# Patient Record
Sex: Female | Born: 1960
Health system: Southern US, Community
[De-identification: ages and names within clinical notes are randomized; demographics above are authoritative.]

## PROBLEM LIST (undated history)

## (undated) DIAGNOSIS — C44300 Unspecified malignant neoplasm of skin of unspecified part of face: Secondary | ICD-10-CM

## (undated) DIAGNOSIS — C44702 Unspecified malignant neoplasm of skin of right lower limb, including hip: Secondary | ICD-10-CM

## (undated) DIAGNOSIS — E079 Disorder of thyroid, unspecified: Secondary | ICD-10-CM

## (undated) HISTORY — PX: BREAST SURGERY: SHX581

## (undated) HISTORY — DX: Unspecified malignant neoplasm of skin of right lower limb, including hip: C44.702

## (undated) HISTORY — DX: Disorder of thyroid, unspecified: E07.9

## (undated) HISTORY — PX: SKIN CANCER EXCISION: SHX779

## (undated) HISTORY — DX: Unspecified malignant neoplasm of skin of unspecified part of face: C44.300

---

## 2005-10-03 ENCOUNTER — Encounter (HOSPITAL_COMMUNITY): Admission: RE | Admit: 2005-10-03 | Discharge: 2005-10-05 | Payer: Self-pay | Admitting: Internal Medicine

## 2005-12-26 ENCOUNTER — Emergency Department (HOSPITAL_COMMUNITY): Admission: EM | Admit: 2005-12-26 | Discharge: 2005-12-26 | Payer: Self-pay | Admitting: Emergency Medicine

## 2006-08-06 ENCOUNTER — Encounter (HOSPITAL_COMMUNITY): Admission: RE | Admit: 2006-08-06 | Discharge: 2006-11-04 | Payer: Self-pay | Admitting: Endocrinology

## 2010-02-07 ENCOUNTER — Encounter: Admission: RE | Admit: 2010-02-07 | Discharge: 2010-02-07 | Payer: Self-pay | Admitting: Family Medicine

## 2010-11-26 ENCOUNTER — Encounter: Payer: Self-pay | Admitting: Internal Medicine

## 2010-11-27 ENCOUNTER — Encounter: Payer: Self-pay | Admitting: Endocrinology

## 2012-11-06 DIAGNOSIS — C44300 Unspecified malignant neoplasm of skin of unspecified part of face: Secondary | ICD-10-CM

## 2012-11-06 HISTORY — DX: Unspecified malignant neoplasm of skin of unspecified part of face: C44.300

## 2013-08-06 ENCOUNTER — Encounter: Payer: Self-pay | Admitting: *Deleted

## 2013-08-19 ENCOUNTER — Ambulatory Visit: Payer: 59 | Admitting: Family Medicine

## 2013-08-19 VITALS — BP 122/74 | HR 75 | Temp 98.4°F | Resp 17 | Ht 64.0 in | Wt 120.0 lb

## 2013-08-19 DIAGNOSIS — H109 Unspecified conjunctivitis: Secondary | ICD-10-CM

## 2013-08-19 DIAGNOSIS — H579 Unspecified disorder of eye and adnexa: Secondary | ICD-10-CM

## 2013-08-19 DIAGNOSIS — H5789 Other specified disorders of eye and adnexa: Secondary | ICD-10-CM

## 2013-08-19 MED ORDER — TOBRAMYCIN-DEXAMETHASONE 0.3-0.1 % OP SUSP: 1.0000 [drp] | OPHTHALMIC | Status: DC

## 2013-08-19 NOTE — Patient Instructions (Signed)
Conjunctivitis °Conjunctivitis is commonly called "pink eye." Conjunctivitis can be caused by bacterial or viral infection, allergies, or injuries. There is usually redness of the lining of the eye, itching, discomfort, and sometimes discharge. There may be deposits of matter along the eyelids. A viral infection usually causes a watery discharge, while a bacterial infection causes a yellowish, thick discharge. Pink eye is very contagious and spreads by direct contact. °You may be given antibiotic eyedrops as part of your treatment. Before using your eye medicine, remove all drainage from the eye by washing gently with warm water and cotton balls. Continue to use the medication until you have awakened 2 mornings in a row without discharge from the eye. Do not rub your eye. This increases the irritation and helps spread infection. Use separate towels from other household members. Wash your hands with soap and water before and after touching your eyes. Use cold compresses to reduce pain and sunglasses to relieve irritation from light. Do not wear contact lenses or wear eye makeup until the infection is gone. °SEEK MEDICAL CARE IF:  °· Your symptoms are not better after 3 days of treatment. °· You have increased pain or trouble seeing. °· The outer eyelids become very red or swollen. °Document Released: 11/30/2004 Document Revised: 01/15/2012 Document Reviewed: 10/23/2005 °ExitCare® Patient Information ©2014 [employer]. ° °Allergic Conjunctivitis °The conjunctiva is a thin membrane that covers the visible white part of the eyeball and the underside of the eyelids. This membrane protects and lubricates the eye. The membrane has small blood vessels running through it that can normally be seen. When the conjunctiva becomes inflamed, the condition is called conjunctivitis. In response to the inflammation, the conjunctival blood vessels become swollen. The swelling results in redness in the normally white part of the  eye. °The blood vessels of this membrane also react when a person has allergies and is then called allergic conjunctivitis. This condition usually lasts for as long as the allergy persists. Allergic conjunctivitis cannot be passed to another person (non-contagious). The likelihood of bacterial infection is great and the cause is not likely due to allergies if the inflamed eye has: °· A sticky discharge. °· Discharge or sticking together of the lids in the morning. °· Scaling or flaking of the eyelids where the eyelashes come out. °· Red swollen eyelids. °CAUSES  °· Viruses. °· Irritants such as foreign bodies. °· Chemicals. °· General allergic reactions. °· Inflammation or serious diseases in the inside or the outside of the eye or the orbit (the boney cavity in which the eye sits) can cause a "red eye." °SYMPTOMS  °· Eye redness. °· Tearing. °· Itchy eyes. °· Burning feeling in the eyes. °· Clear drainage from the eye. °· Allergic reaction due to pollens or ragweed sensitivity. Seasonal allergic conjunctivitis is frequent in the spring when pollens are in the air and in the fall. °DIAGNOSIS  °This condition, in its many forms, is usually diagnosed based on the history and an ophthalmological exam. It usually involves both eyes. If your eyes react at the same time every year, allergies may be the cause. While most "red eyes" are due to allergy or an infection, the role of an eye (ophthalmological) exam is important. The exam can rule out serious diseases of the eye or orbit. °TREATMENT  °· Non-antibiotic eye drops, ointments, or medications by mouth may be prescribed if the ophthalmologist is sure the conjunctivitis is due to allergies alone. °· Over-the-counter drops and ointments for allergic symptoms should   be used only after other causes of conjunctivitis have been ruled out, or as your caregiver suggests. °Medications by mouth are often prescribed if other allergy-related symptoms are present. If the  ophthalmologist is sure that the conjunctivitis is due to allergies alone, treatment is normally limited to drops or ointments to reduce itching and burning. °HOME CARE INSTRUCTIONS  °· Wash hands before and after applying drops or ointments, or touching the inflamed eye(s) or eyelids. °· Do not let the eye dropper tip or ointment tube touch the eyelid when putting medicine in your eye. °· Stop using your soft contact lenses and throw them away. Use a new pair of lenses when recovery is complete. You should run through sterilizing cycles at least three times before use after complete recovery if the old soft contact lenses are to be used. Hard contact lenses should be stopped. They need to be thoroughly sterilized before use after recovery. °· Itching and burning eyes due to allergies is often relieved by using a cool cloth applied to closed eye(s). °SEEK MEDICAL CARE IF:  °· Your problems do not go away after two or three days of treatment. °· Your lids are sticky (especially in the morning when you wake up) or stick together. °· Discharge develops. Antibiotics may be needed either as drops, ointment, or by mouth. °· You have extreme light sensitivity. °· An oral temperature above 102° F (38.9° C) develops. °· Pain in or around the eye or any other visual symptom develops. °MAKE SURE YOU:  °· Understand these instructions. °· Will watch your condition. °· Will get help right away if you are not doing well or get worse. °Document Released: 01/13/2003 Document Revised: 01/15/2012 Document Reviewed: 12/09/2007 °ExitCare® Patient Information ©2014 [employer]. ° °

## 2013-08-19 NOTE — Progress Notes (Signed)
  [facility]:  Office Visit  Chief Complaint:  Chief Complaint  Patient presents with  . Eye Injury    swelling and redness     HPI: Amber Williamson is a 52 y.o. female who is here for  2 week history of right eye irritation, she has has redness and also itching. No pain. Some swelling along nasal area.  No HA, wears contacts and has been wearing it even with eye irritation, not . No rashes, no h/o of shingles. No vision changes, no eye pain.  + eye redness, + minimal clear dc. No pus.   History reviewed. No pertinent past medical history. History reviewed. No pertinent past surgical history. History   Social History  . Marital Status: Married    Spouse Name: N/A    Number of Children: N/A  . Years of Education: N/A   Social History Main Topics  . Smoking status: Never Smoker   . Smokeless tobacco: None  . Alcohol Use: No  . Drug Use: No  . Sexual Activity: No   Other Topics Concern  . None   Social History Narrative  . None   History reviewed. No pertinent family history. No Known Allergies Prior to Admission medications   Not on File     ROS: The patient denies fevers, chills, night sweats, unintentional weight loss, chest pain, palpitations, wheezing, dyspnea on exertion, nausea, vomiting, abdominal pain, dysuria, hematuria, melena, numbness, weakness, or tingling.  All other systems have been reviewed and were otherwise negative with the exception of those mentioned in the HPI and as above.    PHYSICAL EXAM: Filed Vitals:   08/19/13 1812  BP: 122/74  Pulse: 75  Temp: 98.4 F (36.9 C)  Resp: 17   Filed Vitals:   08/19/13 1812  Height: 5\' 4"  (1.626 m)  Weight: 120 lb (54.432 kg)   Body mass index is 20.59 kg/(m^2).  General: Alert, no acute distress HEENT:  Normocephalic, atraumatic, oropharynx patent. EOMI, PERRLA, fundoscopic exam nl, + erythematous conjunctiva right eye, + clear dc Cardiovascular:   No pedal edema.   Respiratory:  No cyanosis, no use of accessory musculature GI: No organomegaly, abdomen is soft and non-tender, positive bowel sounds.  No masses. Skin: No rashes. Neurologic: Facial musculature symmetric. Psychiatric: Patient is appropriate throughout our interaction. Musculoskeletal: Gait intact.   LABS: No results found for this or any previous visit.   EKG/XRAY:   Primary read interpreted by Dr. Conley Rolls at [facility].   ASSESSMENT/PLAN: Encounter Diagnoses  Name Primary?  . Conjunctivitis Yes  . Irritation of right eye    Fluoroscein exam normal Eye exam normal except for erythema Rx tobradex x 3 days Gross sideeffects, risk and benefits, and alternatives of medications d/w patient. Patient is aware that all medications have potential sideeffects and we are unable to predict every sideeffect or drug-drug interaction that may occur.  Hamilton Capri PHUONG, DO 08/19/2013 7:38 PM

## 2014-06-03 ENCOUNTER — Telehealth: Payer: Self-pay

## 2014-06-03 NOTE — Telephone Encounter (Signed)
Patient called to let us know that [facility] is sending over mammogram results to be placed in her record.  She is having the results send to Dr. Laney Pastor.  She has seen Dr. Marin Comment and Dr. Laney Pastor and will be making an appointment in the future.

## 2014-08-03 ENCOUNTER — Encounter: Payer: Self-pay | Admitting: Family Medicine

## 2015-02-23 ENCOUNTER — Ambulatory Visit (INDEPENDENT_AMBULATORY_CARE_PROVIDER_SITE_OTHER): Payer: 59 | Admitting: Emergency Medicine

## 2015-02-23 VITALS — BP 128/66 | HR 93 | Temp 98.3°F | Resp 16 | Ht 63.5 in | Wt 121.0 lb

## 2015-02-23 DIAGNOSIS — J020 Streptococcal pharyngitis: Secondary | ICD-10-CM | POA: Diagnosis not present

## 2015-02-23 DIAGNOSIS — Z1211 Encounter for screening for malignant neoplasm of colon: Secondary | ICD-10-CM

## 2015-02-23 MED ORDER — PENICILLIN V POTASSIUM 500 MG PO TABS: 500.0000 mg | ORAL_TABLET | Freq: Four times a day (QID) | ORAL | Status: DC

## 2015-02-23 NOTE — Patient Instructions (Signed)

## 2015-02-23 NOTE — Progress Notes (Signed)
[facility] [address] [place] [address] [phone]  Date:  9/89/2119   Name:  Amber Williamson   DOB:  Aug 08, 1961   MRN:  [id number]  PCP:  No primary care provider on file.    Chief Complaint: Sore Throat; Dizziness; joint/pain; Numbness; Hot Flashes; and Fatigue   History of Present Illness:  Amber Williamson is a 54 y.o. very pleasant female patient who presents with the following:  Sore throat Sunday.  Sudden onset Now fatigued and has no energy Has generalized malaise and myalgias No cough or coryza No wheezing or shortness of breath Fever and chilled feeling. No ill contacts. No rash  No nausea or vomiting No improvement with over the counter medications or other home remedies.  Denies other complaint or health concern today.   There are no active problems to display for this patient.   Past Medical History  Diagnosis Date  . Thyroid disease     Past Surgical History  Procedure Laterality Date  . Cosmetic surgery      History  Substance Use Topics  . Smoking status: Current Some Day Smoker  . Smokeless tobacco: Not on file  . Alcohol Use: No    History reviewed. No pertinent family history.  No Known Allergies  Medication list has been reviewed and updated.  No current outpatient prescriptions on file prior to visit.   No current facility-administered medications on file prior to visit.    Review of Systems:  As per HPI, otherwise negative.    Physical Examination: Filed Vitals:   02/23/15 1415  BP: 128/66  Pulse: 93  Temp: 98.3 F (36.8 C)  Resp: 16   Filed Vitals:   02/23/15 1415  Height: 5' 3.5" (1.613 m)  Weight: 121 lb (54.885 kg)   Body mass index is 21.1 kg/(m^2). Ideal Body Weight: Weight in (lb) to have BMI = 25: 143.1  GEN: WDWN, NAD, Non-toxic, A & O x 3 HEENT: Atraumatic, Normocephalic. Neck supple. No masses, No LAD.erythematous and injected oropharynx Ears and Nose: No external  deformity. CV: RRR, No M/G/R. No JVD. No thrill. No extra heart sounds. PULM: CTA B, no wheezes, crackles, rhonchi. No retractions. No resp. distress. No accessory muscle use. ABD: S, NT, ND, +BS. No rebound. No HSM. EXTR: No c/c/e NEURO Normal gait.  PSYCH: Normally interactive. Conversant. Not depressed or anxious appearing.  Calm demeanor.    Assessment and Plan: Strep Pen vk  Signed,  Ellison Carwin, MD

## 2015-08-06 LAB — HM MAMMOGRAPHY

## 2016-11-06 DIAGNOSIS — C44702 Unspecified malignant neoplasm of skin of right lower limb, including hip: Secondary | ICD-10-CM

## 2016-11-06 HISTORY — DX: Unspecified malignant neoplasm of skin of right lower limb, including hip: C44.702

## 2017-02-26 ENCOUNTER — Ambulatory Visit (INDEPENDENT_AMBULATORY_CARE_PROVIDER_SITE_OTHER): Payer: 59 | Admitting: Family Medicine

## 2017-02-26 VITALS — BP 115/71 | HR 69 | Temp 97.9°F | Resp 16 | Ht 63.5 in | Wt 120.0 lb

## 2017-02-26 DIAGNOSIS — S81801A Unspecified open wound, right lower leg, initial encounter: Secondary | ICD-10-CM

## 2017-02-26 DIAGNOSIS — Z85828 Personal history of other malignant neoplasm of skin: Secondary | ICD-10-CM

## 2017-02-26 MED ORDER — MUPIROCIN CALCIUM 2 % EX CREA: 1.0000 "application " | TOPICAL_CREAM | Freq: Two times a day (BID) | CUTANEOUS | 0 refills | Status: DC

## 2017-02-26 NOTE — Patient Instructions (Addendum)
Soap and water cleansing over area once daily times per day, apply Bactroban ointment once or twice per day. I will refer you to dermatology to evaluate this area further, but try to avoid scraping area until seen by dermatology.   Return to the clinic or go to the nearest emergency room if any of your symptoms worsen or new symptoms occur.   IF you received an x-ray today, you will receive an invoice from [facility]. Please contact [facility] at [phone] with questions or concerns regarding your invoice.   IF you received labwork today, you will receive an invoice from [facility]. Please contact [facility] at [phone] with questions or concerns regarding your invoice.   Our billing staff will not be able to assist you with questions regarding bills from these companies.  You will be contacted with the lab results as soon as they are available. The fastest way to get your results is to activate your My Chart account. Instructions are located on the last page of this paperwork. If you have not heard from [place] regarding the results in 2 weeks, please contact this office.

## 2017-02-26 NOTE — Progress Notes (Signed)
By signing my name below, I, Amber Williamson, attest that this documentation has been prepared under the direction and in the presence of Amber Ray, MD.  Electronically Signed: Verlee Williamson, Medical Scribe. 02/26/17. 6:03 PM.  Subjective:    Patient ID: Amber Williamson, female    DOB: 1961-04-05, 56 y.o.   MRN: [id number]  HPI Chief Complaint  Patient presents with  . Insect Bite    HPI Comments: Amber Williamson is a 56 y.o. female who presents to the Primary Care at [facility] complaining of sore wound located on her right lower leg, above ankle onset 2 months ago. It was initially was a bump and then it turned into a pimple. Pt has been using OTC with a silver component in it, and hydrogen peroxide intermittently without relief of her sxs. Pt opened it with a needle a couple of weeks ago and it didn't have any drainage. She also admits accidentally cutting the top when shaving her legs and mentions having dry/itchy/scaly skin. Pt has a dermatologist [facility]) but hasn't been seen for her sxs. Pt had a superficial skin CA on her forehead located in between the eyes 6-7 years ago - was removed and hasn't found anymore since. Denies being on abx for the past 2 months for her wound. Denies itchiness from the concerned area.  There are no active problems to display for this patient.  Past Medical History:  Diagnosis Date  . Thyroid disease    Past Surgical History:  Procedure Laterality Date  . COSMETIC SURGERY     No Known Allergies Prior to Admission medications   Medication Sig Start Date End Date Taking? Authorizing Provider  penicillin v potassium (VEETID) 500 MG tablet Take 1 tablet (500 mg total) by mouth 4 (four) times daily. Patient not taking: Reported on 02/26/2017 02/23/15   Roselee Culver, MD   Social History   Social History  . Marital status: Married    Spouse name: N/A  . Number of children: N/A  . Years of  education: N/A   Occupational History  . Not on file.   Social History Main Topics  . Smoking status: Current Some Day Smoker  . Smokeless tobacco: Never Used  . Alcohol use No  . Drug use: No  . Sexual activity: No   Other Topics Concern  . Not on file   Social History Narrative  . No narrative on file   Review of Systems  Skin: Positive for color change and wound. Negative for rash.   Objective:  Physical Exam  Constitutional: She appears well-developed and well-nourished. No distress.  HENT:  Head: Normocephalic and atraumatic.  Eyes: Conjunctivae are normal.  Neck: Neck supple.  Cardiovascular: Normal rate.   Pulmonary/Chest: Effort normal.  Neurological: She is alert.  Skin: Skin is warm and dry.  On right lower lateral leg there is a lesion that measures 2 cm across with minimal erythema, more pink appearence Hyperkeratotic centrally with scar tissue centrally  Psychiatric: She has a normal mood and affect. Her behavior is normal.  Nursing note and vitals reviewed.   Vitals:   02/26/17 1704  BP: 115/71  Pulse: 69  Resp: 16  Temp: 97.9 F (36.6 C)  TempSrc: Oral  SpO2: 100%  Weight: 120 lb (54.4 kg)  Height: 5' 3.5" (1.613 m)  Body mass index is 20.92 kg/m. Assessment & Plan:   OTIS BURRESS is a 56 y.o. female Wound of right lower  extremity, initial encounter - Plan: mupirocin cream (BACTROBAN) 2 %, Ambulatory referral to Dermatology  History of skin cancer - Plan: Ambulatory referral to Dermatology  Persistent lesion on lower leg, initial history possible papule versus infected area, then with scar tissue. May have irritated area to cause more scar tissue and delayed healing, but with length of time of area, and previous history of skin cancer, recommended evaluation with dermatology. For now can apply Bactroban cream, otherwise avoid other interventions to affected area until evaluated by dermatology.  Meds ordered this encounter  Medications    . mupirocin cream (BACTROBAN) 2 %    Sig: Apply 1 application topically 2 (two) times daily.    Dispense:  15 g    Refill:  0   Patient Instructions   Soap and water cleansing over area once daily times per day, apply Bactroban ointment once or twice per day. I will refer you to dermatology to evaluate this area further, but try to avoid scraping area until seen by dermatology.   Return to the clinic or go to the nearest emergency room if any of your symptoms worsen or new symptoms occur.   IF you received an x-Williamson today, you will receive an invoice from [facility]. Please contact [facility] at [phone] with questions or concerns regarding your invoice.   IF you received labwork today, you will receive an invoice from [facility]. Please contact [facility] at [phone] with questions or concerns regarding your invoice.   Our billing staff will not be able to assist you with questions regarding bills from these companies.  You will be contacted with the lab results as soon as they are available. The fastest way to get your results is to activate your My Chart account. Instructions are located on the last page of this paperwork. If you have not heard from [place] regarding the results in 2 weeks, please contact this office.       I personally performed the services described in this documentation, which was scribed in my presence. The recorded information has been reviewed and considered for accuracy and completeness, addended by me as needed, and agree with information above.  Signed,   Amber Ray, MD Primary [facility] at [facility].  02/27/17 10:21 PM

## 2017-02-27 LAB — HM MAMMOGRAPHY

## 2017-04-11 ENCOUNTER — Encounter: Payer: Self-pay | Admitting: Family Medicine

## 2017-04-11 DIAGNOSIS — Z85828 Personal history of other malignant neoplasm of skin: Secondary | ICD-10-CM | POA: Insufficient documentation

## 2018-01-04 DIAGNOSIS — Z08 Encounter for follow-up examination after completed treatment for malignant neoplasm: Secondary | ICD-10-CM | POA: Diagnosis not present

## 2018-01-04 DIAGNOSIS — Z85828 Personal history of other malignant neoplasm of skin: Secondary | ICD-10-CM | POA: Diagnosis not present

## 2018-01-04 DIAGNOSIS — L570 Actinic keratosis: Secondary | ICD-10-CM | POA: Diagnosis not present

## 2018-01-04 DIAGNOSIS — X32XXXD Exposure to sunlight, subsequent encounter: Secondary | ICD-10-CM | POA: Diagnosis not present

## 2018-01-13 ENCOUNTER — Ambulatory Visit: Payer: Self-pay | Admitting: Emergency Medicine

## 2018-01-13 VITALS — BP 114/70 | HR 89 | Temp 98.4°F | Resp 20

## 2018-01-13 DIAGNOSIS — J069 Acute upper respiratory infection, unspecified: Secondary | ICD-10-CM

## 2018-01-13 NOTE — Patient Instructions (Signed)

## 2018-01-13 NOTE — Progress Notes (Signed)
Subjective:     Amber Williamson is a 57 y.o. female who presents for evaluation of symptoms of a URI. Symptoms include achiness, congestion, coryza, nasal congestion, post nasal drip and sore throat. Onset of symptoms was 1 day ago, and has been unchanged since that time. Treatment to date: none. Denies n/v/d, myalgias, decreased appetite     Review of Systems Pertinent items are noted in HPI.   Objective:    BP 114/70 (BP Location: Right Arm, Patient Position: Sitting, Cuff Size: Normal)   Pulse 89   Temp 98.4 F (36.9 C) (Oral)   Resp 20   SpO2 98%  General appearance: alert, cooperative and appears stated age Head: Normocephalic, without obvious abnormality, atraumatic Ears: normal TM's and external ear canals both ears Nose: Nares normal. Septum midline. Mucosa normal. No drainage or sinus tenderness. Throat: lips, mucosa, and tongue normal; teeth and gums normal Neck: no adenopathy Lungs: clear to auscultation bilaterally Heart: regular rate and rhythm Extremities: extremities normal, atraumatic, no cyanosis or edema Pulses: 2+ and symmetric   Assessment:    viral upper respiratory illness   Plan:    Discussed diagnosis and treatment of URI. Discussed the importance of avoiding unnecessary antibiotic therapy. Suggested symptomatic OTC remedies. Nasal saline spray for congestion. Follow up as needed. Follow up in 1 week or as needed. Tylenol or motrin, Antihistamines, decongestants, rest, fluids, return as needed

## 2018-01-15 ENCOUNTER — Telehealth: Payer: Self-pay | Admitting: Emergency Medicine

## 2018-02-16 ENCOUNTER — Ambulatory Visit (INDEPENDENT_AMBULATORY_CARE_PROVIDER_SITE_OTHER): Payer: 59 | Admitting: Physician Assistant

## 2018-02-16 ENCOUNTER — Encounter: Payer: Self-pay | Admitting: Physician Assistant

## 2018-02-16 ENCOUNTER — Other Ambulatory Visit: Payer: Self-pay

## 2018-02-16 VITALS — BP 102/68 | HR 80 | Temp 97.8°F | Resp 18 | Ht 63.0 in | Wt 116.2 lb

## 2018-02-16 DIAGNOSIS — Z1211 Encounter for screening for malignant neoplasm of colon: Secondary | ICD-10-CM | POA: Diagnosis not present

## 2018-02-16 DIAGNOSIS — Z114 Encounter for screening for human immunodeficiency virus [HIV]: Secondary | ICD-10-CM

## 2018-02-16 DIAGNOSIS — Z13220 Encounter for screening for lipoid disorders: Secondary | ICD-10-CM

## 2018-02-16 DIAGNOSIS — Z0000 Encounter for general adult medical examination without abnormal findings: Secondary | ICD-10-CM | POA: Diagnosis not present

## 2018-02-16 DIAGNOSIS — Z130 Encounter for screening for diseases of the blood and blood-forming organs and certain disorders involving the immune mechanism: Secondary | ICD-10-CM

## 2018-02-16 DIAGNOSIS — Z13228 Encounter for screening for other metabolic disorders: Secondary | ICD-10-CM | POA: Diagnosis not present

## 2018-02-16 DIAGNOSIS — Z1159 Encounter for screening for other viral diseases: Secondary | ICD-10-CM | POA: Diagnosis not present

## 2018-02-16 DIAGNOSIS — Z1389 Encounter for screening for other disorder: Secondary | ICD-10-CM

## 2018-02-16 DIAGNOSIS — Z1329 Encounter for screening for other suspected endocrine disorder: Secondary | ICD-10-CM

## 2018-02-16 NOTE — Progress Notes (Signed)
Patient ID: Amber Williamson, female    DOB: May 21, 1961, 57 y.o.   MRN: [id number]  PCP: System, Provider Not In  Chief Complaint  Patient presents with  . Annual Exam    no pap     Subjective:   Presents for [employer].  Routine labs annually through her previous employer. Last hands-on exam about 6 years ago with GYN.  Cervical Cancer Screening: sees GYN Breast Cancer Screening: sees GYN Colorectal Cancer Screening: never. Not interested in colonoscopy. Is will to do Cologuard. Bone Density Testing: not yet a candidate HIV Screening: very low risk STI Screening: declines, very low risk Seasonal Influenza Vaccination: current, at work Td/Tdap Vaccination: current, at work Pneumococcal Vaccination: not yet a candidate Zoster Vaccination: not yet   Review of Systems  Constitutional: Negative for chills and fever.  HENT: Negative for congestion and sore throat.   Eyes: Negative for pain.  Respiratory: Negative for cough, shortness of breath and wheezing.   Cardiovascular: Negative for chest pain, palpitations and leg swelling.  Gastrointestinal: Negative for abdominal pain, blood in stool, constipation, diarrhea, nausea and vomiting.  Endocrine: Negative for polydipsia.  Genitourinary: Negative for dysuria, frequency, hematuria and urgency.  Musculoskeletal: Positive for neck pain and neck stiffness. Negative for back pain and myalgias.  Skin: Negative.  Negative for rash.  Allergic/Immunologic: Negative for environmental allergies.  Neurological: Negative for dizziness, weakness and headaches.  Hematological: Does not bruise/bleed easily.  Psychiatric/Behavioral: Positive for decreased concentration and dysphoric mood. Negative for suicidal ideas. The patient is nervous/anxious.     Depression screen PHQ 2/9 02/26/2017  Decreased Interest 0  Down, Depressed, Hopeless 0  PHQ - 2 Score 0    Patient Active Problem List   Diagnosis Date Noted  . History  of SCC (squamous cell carcinoma) of skin 04/11/2017    Past Medical History:  Diagnosis Date  . Cancer of skin of right leg 2018   RIGHT lower leg, squamous cell carcinoma  . Skin cancer of face 2014   squamous cell carcinoma  . Thyroid disease    hyperthyroidism, took a PTU blocker, resolved     Prior to Admission medications   Not on File    No Known Allergies  Past Surgical History:  Procedure Laterality Date  . BREAST SURGERY     augmentation  . SKIN CANCER EXCISION     Forehead 2014, RIGHT lower leg 2018; BCC    Family History  Problem Relation Age of Onset  . Heart disease Mother   . Atrial fibrillation Mother   . Thyroid disease Mother        hypothyroidism  . Heart disease Father   . COPD Father   . Healthy Daughter   . Healthy Son     Social History   Socioeconomic History  . Marital status: Married    Spouse name: Cory Roughen  . Number of children: 2  . Years of education: Not on file  . Highest education level: 12th grade  Occupational History  . 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 Financial resource strain: Not hard at all  . Food insecurity:    Worry: Never true    Inability: Never true  . Transportation needs:    Medical: No    Non-medical: No  Tobacco Use  . Smoking status: Former Research scientist (life sciences)  . Smokeless tobacco: Never Used  . Tobacco comment: Nicorette  gum since 2004  Substance and Sexual Activity  . Alcohol use: No    Alcohol/week: 0.0 - 0.6 oz    Comment: occasional glass of wine  . Drug use: No  . Sexual activity: Yes    Partners: Male    Birth control/protection: Post-menopausal  Lifestyle  . Physical activity:    Days per week: 0 days    Minutes per session: 0 min  . Stress: To some extent  Relationships  . Social connections:    Talks on phone: More than three times a week    Gets together: More than three times a week    Attends religious service: More than 4  times per year    Active member of club or organization: Yes    Attends meetings of clubs or organizations: More than 4 times per year    Relationship status: Married  Other Topics Concern  . Not on file  Social History Narrative   Lives with her husband, who has emphysema.   Adult children live in [place].   Husband's family in [place], [place].   Smoke detectors in home? Yes    Guns in home? No    Consistent seatbelt use? Yes    Consistent dental brushing and flossing? Yes    Semi-Annual dental visits: Yes    Annual Eye exams: Yes         Objective:  Physical Exam  Constitutional: She is oriented to person, place, and time. Vital signs are normal. She appears well-developed and well-nourished. She is active and cooperative. No distress.  BP 102/68   Pulse 80   Temp 97.8 F (36.6 C) (Oral)   Resp 18   Ht 5\' 3"  (1.6 m)   Wt 116 lb 3.2 oz (52.7 kg)   SpO2 98%   BMI 20.58 kg/m    HENT:  Head: Normocephalic and atraumatic.  Right Ear: Hearing, tympanic membrane, external ear and ear canal normal. No foreign bodies.  Left Ear: Hearing, tympanic membrane, external ear and ear canal normal. No foreign bodies.  Nose: Nose normal.  Mouth/Throat: Uvula is midline, oropharynx is clear and moist and mucous membranes are normal. No oral lesions. Normal dentition. No dental abscesses or uvula swelling. No oropharyngeal exudate.  Eyes: Pupils are equal, round, and reactive to light. Conjunctivae, EOM and lids are normal. Right eye exhibits no discharge. Left eye exhibits no discharge. No scleral icterus.  Fundoscopic exam:      The right eye shows no arteriolar narrowing, no AV nicking, no exudate, no hemorrhage and no papilledema. The right eye shows red reflex.       The left eye shows no arteriolar narrowing, no AV nicking, no exudate, no hemorrhage and no papilledema. The left eye shows red reflex.  Neck: Trachea normal, normal range of motion and full passive range of motion  without pain. Neck supple. No spinous process tenderness and no muscular tenderness present. No thyroid mass and no thyromegaly present.  Cardiovascular: Normal rate, regular rhythm, normal heart sounds, intact distal pulses and normal pulses.  Pulmonary/Chest: Effort normal and breath sounds normal.  Musculoskeletal: She exhibits no edema or tenderness.       Cervical back: Normal.       Thoracic back: Normal.       Lumbar back: Normal.  Lymphadenopathy:       Head (right side): No tonsillar, no preauricular, no posterior auricular and no occipital adenopathy present.       Head (left side):  No tonsillar, no preauricular, no posterior auricular and no occipital adenopathy present.    She has no cervical adenopathy.       Right: No supraclavicular adenopathy present.       Left: No supraclavicular adenopathy present.  Neurological: She is alert and oriented to person, place, and time. She has normal strength and normal reflexes. No cranial nerve deficit. She exhibits normal muscle tone. Coordination and gait normal.  Skin: Skin is warm, dry and intact. No rash noted. She is not diaphoretic. No cyanosis or erythema. Nails show no clubbing.  Psychiatric: She has a normal mood and affect. Her speech is normal and behavior is normal. Judgment and thought content normal.     Wt Readings from Last 3 Encounters:  02/16/18 116 lb 3.2 oz (52.7 kg)  02/26/17 120 lb (54.4 kg)  02/23/15 121 lb (54.9 kg)     Visual Acuity Screening   Right eye Left eye Both eyes  Without correction:     With correction: 20/20 20/40 20/20          Assessment & Plan:  1. Annual physical exam Age appropriate anticipatory guidance provided.  2. Screening for deficiency anemia - CBC with Differential/Platelet  3. Screening for colon cancer - Cologuard  4. Need for hepatitis C screening test - Hepatitis C antibody  5. Screening for HIV (human immunodeficiency virus) - HIV antibody  6. Screening for  hyperlipidemia - Lipid panel  7. Screening for metabolic disorder - Comprehensive metabolic panel  8. Screening for thyroid disorder History of hyperthyroidism, no longer on treatment - T4, free - TSH  9. Screening for blood or protein in urine - Urinalysis, dipstick only    No follow-ups on file.   Fara Chute, PA-C [facility]

## 2018-02-16 NOTE — Patient Instructions (Addendum)
Please schedule with your GYN.     IF you received an x-ray today, you will receive an invoice from [facility]. Please contact [facility] at [phone] with questions or concerns regarding your invoice.   IF you received labwork today, you will receive an invoice from [facility]. Please contact [facility] at [phone] with questions or concerns regarding your invoice.   Our billing staff will not be able to assist you with questions regarding bills from these companies.  You will be contacted with the lab results as soon as they are available. The fastest way to get your results is to activate your My Chart account. Instructions are located on the last page of this paperwork. If you have not heard from [place] regarding the results in 2 weeks, please contact this office.      Preventive Care 40-64 Years, Female Preventive care refers to lifestyle choices and visits with your health care provider that can promote health and wellness. What does preventive care include?  A yearly physical exam. This is also called an annual well check.  Dental exams once or twice a year.  Routine eye exams. Ask your health care provider how often you should have your eyes checked.  Personal lifestyle choices, including: ? Daily care of your teeth and gums. ? Regular physical activity. ? Eating a healthy diet. ? Avoiding tobacco and drug use. ? Limiting alcohol use. ? Practicing safe sex. ? Taking low-dose aspirin daily starting at age 78. ? Taking vitamin and mineral supplements as recommended by your health care provider. What happens during an annual well check? The services and screenings done by your health care provider during your annual well check will depend on your age, overall health, lifestyle risk factors, and family history of disease. Counseling Your health care provider may ask you questions about your:  Alcohol use.  Tobacco use.  Drug use.  Emotional  well-being.  Home and relationship well-being.  Sexual activity.  Eating habits.  Work and work Statistician.  Method of birth control.  Menstrual cycle.  Pregnancy history.  Screening You may have the following tests or measurements:  Height, weight, and BMI.  Blood pressure.  Lipid and cholesterol levels. These may be checked every 5 years, or more frequently if you are over 68 years old.  Skin check.  Lung cancer screening. You may have this screening every year starting at age 71 if you have a 30-pack-year history of smoking and currently smoke or have quit within the past 15 years.  Fecal occult blood test (FOBT) of the stool. You may have this test every year starting at age 74.  Flexible sigmoidoscopy or colonoscopy. You may have a sigmoidoscopy every 5 years or a colonoscopy every 10 years starting at age 8.  Hepatitis C blood test.  Hepatitis B blood test.  Sexually transmitted disease (STD) testing.  Diabetes screening. This is done by checking your blood sugar (glucose) after you have not eaten for a while (fasting). You may have this done every 1-3 years.  Mammogram. This may be done every 1-2 years. Talk to your health care provider about when you should start having regular mammograms. This may depend on whether you have a family history of breast cancer.  BRCA-related cancer screening. This may be done if you have a family history of breast, ovarian, tubal, or peritoneal cancers.  Pelvic exam and Pap test. This may be done every 3 years starting at age 45. Starting at age 76, this 55  be done every 5 years if you have a Pap test in combination with an HPV test.  Bone density scan. This is done to screen for osteoporosis. You may have this scan if you are at high risk for osteoporosis.  Discuss your test results, treatment options, and if necessary, the need for more tests with your health care provider. Vaccines Your health care provider may recommend  certain vaccines, such as:  Influenza vaccine. This is recommended every year.  Tetanus, diphtheria, and acellular pertussis (Tdap, Td) vaccine. You may need a Td booster every 10 years.  Varicella vaccine. You may need this if you have not been vaccinated.  Zoster vaccine. You may need this after age 66.  Measles, mumps, and rubella (MMR) vaccine. You may need at least one dose of MMR if you were born in 1957 or later. You may also need a second dose.  Pneumococcal 13-valent conjugate (PCV13) vaccine. You may need this if you have certain conditions and were not previously vaccinated.  Pneumococcal polysaccharide (PPSV23) vaccine. You may need one or two doses if you smoke cigarettes or if you have certain conditions.  Meningococcal vaccine. You may need this if you have certain conditions.  Hepatitis A vaccine. You may need this if you have certain conditions or if you travel or work in places where you may be exposed to hepatitis A.  Hepatitis B vaccine. You may need this if you have certain conditions or if you travel or work in places where you may be exposed to hepatitis B.  Haemophilus influenzae type b (Hib) vaccine. You may need this if you have certain conditions.  Talk to your health care provider about which screenings and vaccines you need and how often you need them. This information is not intended to replace advice given to you by your health care provider. Make sure you discuss any questions you have with your health care provider. Document Released: 11/19/2015 Document Revised: 07/12/2016 Document Reviewed: 08/24/2015 Elsevier Interactive Patient Education  [employer].

## 2018-02-17 LAB — CBC WITH DIFFERENTIAL/PLATELET
Basophils Absolute: 0 10*3/uL (ref 0.0–0.2)
Basos: 1 %
EOS (ABSOLUTE): 0.1 10*3/uL (ref 0.0–0.4)
Eos: 3 %
Hematocrit: 40.1 % (ref 34.0–46.6)
Hemoglobin: 13.3 g/dL (ref 11.1–15.9)
Immature Grans (Abs): 0 10*3/uL (ref 0.0–0.1)
Immature Granulocytes: 0 %
Lymphocytes Absolute: 1.5 10*3/uL (ref 0.7–3.1)
Lymphs: 33 %
MCH: 30.7 pg (ref 26.6–33.0)
MCHC: 33.2 g/dL (ref 31.5–35.7)
MCV: 93 fL (ref 79–97)
Monocytes Absolute: 0.4 10*3/uL (ref 0.1–0.9)
Monocytes: 9 %
Neutrophils Absolute: 2.4 10*3/uL (ref 1.4–7.0)
Neutrophils: 54 %
Platelets: 254 10*3/uL (ref 150–379)
RBC: 4.33 x10E6/uL (ref 3.77–5.28)
RDW: 12.9 % (ref 12.3–15.4)
WBC: 4.4 10*3/uL (ref 3.4–10.8)

## 2018-02-17 LAB — COMPREHENSIVE METABOLIC PANEL
ALT: 15 IU/L (ref 0–32)
AST: 20 IU/L (ref 0–40)
Albumin/Globulin Ratio: 2 (ref 1.2–2.2)
Albumin: 4.7 g/dL (ref 3.5–5.5)
Alkaline Phosphatase: 77 IU/L (ref 39–117)
BUN/Creatinine Ratio: 23 (ref 9–23)
BUN: 15 mg/dL (ref 6–24)
Bilirubin Total: 0.5 mg/dL (ref 0.0–1.2)
CO2: 23 mmol/L (ref 20–29)
Calcium: 9.8 mg/dL (ref 8.7–10.2)
Chloride: 105 mmol/L (ref 96–106)
Creatinine, Ser: 0.66 mg/dL (ref 0.57–1.00)
GFR calc Af Amer: 113 mL/min/{1.73_m2} (ref >59)
GFR calc non Af Amer: 98 mL/min/{1.73_m2} (ref >59)
Globulin, Total: 2.3 g/dL (ref 1.5–4.5)
Glucose: 83 mg/dL (ref 65–99)
Potassium: 4.2 mmol/L (ref 3.5–5.2)
Sodium: 142 mmol/L (ref 134–144)
Total Protein: 7 g/dL (ref 6.0–8.5)

## 2018-02-17 LAB — URINALYSIS, DIPSTICK ONLY
Bilirubin, UA: NEGATIVE
Glucose, UA: NEGATIVE
Ketones, UA: NEGATIVE
Leukocytes, UA: NEGATIVE
Nitrite, UA: NEGATIVE
Protein, UA: NEGATIVE
RBC, UA: NEGATIVE
Specific Gravity, UA: 1.006 (ref 1.005–1.030)
Urobilinogen, Ur: 0.2 mg/dL (ref 0.2–1.0)
pH, UA: 6.5 (ref 5.0–7.5)

## 2018-02-17 LAB — LIPID PANEL
Chol/HDL Ratio: 2.7 ratio (ref 0.0–4.4)
Cholesterol, Total: 191 mg/dL (ref 100–199)
HDL: 72 mg/dL (ref >39)
LDL Calculated: 110 mg/dL — ABNORMAL HIGH (ref 0–99)
Triglycerides: 46 mg/dL (ref 0–149)
VLDL Cholesterol Cal: 9 mg/dL (ref 5–40)

## 2018-02-17 LAB — HEPATITIS C ANTIBODY: Hep C Virus Ab: <0.1 s/co ratio (ref 0.0–0.9)

## 2018-02-17 LAB — T4, FREE: Free T4: 1.33 ng/dL (ref 0.82–1.77)

## 2018-02-17 LAB — TSH: TSH: 1.11 u[IU]/mL (ref 0.450–4.500)

## 2018-02-17 LAB — HIV ANTIBODY (ROUTINE TESTING W REFLEX): HIV Screen 4th Generation wRfx: NONREACTIVE

## 2018-02-25 DIAGNOSIS — Z1211 Encounter for screening for malignant neoplasm of colon: Secondary | ICD-10-CM | POA: Diagnosis not present

## 2018-02-25 DIAGNOSIS — Z1212 Encounter for screening for malignant neoplasm of rectum: Secondary | ICD-10-CM | POA: Diagnosis not present

## 2018-03-07 ENCOUNTER — Telehealth: Payer: Self-pay | Admitting: Physician Assistant

## 2018-03-07 NOTE — Telephone Encounter (Signed)
Please see below.

## 2018-03-07 NOTE — Telephone Encounter (Signed)
Positive Colocare result was faxed to office- call to verify received. Call to office to notify.

## 2018-03-25 ENCOUNTER — Telehealth: Payer: Self-pay | Admitting: Physician Assistant

## 2018-03-25 DIAGNOSIS — R195 Other fecal abnormalities: Secondary | ICD-10-CM | POA: Insufficient documentation

## 2018-03-25 NOTE — Telephone Encounter (Signed)
Patient previously reluctant to have colonoscopy. After discussion and thought, she agrees to referral, due to positive Cologuard.

## 2018-03-26 LAB — COLOGUARD: Cologuard: POSITIVE

## 2018-06-14 DIAGNOSIS — H52223 Regular astigmatism, bilateral: Secondary | ICD-10-CM | POA: Diagnosis not present

## 2018-06-14 DIAGNOSIS — H524 Presbyopia: Secondary | ICD-10-CM | POA: Diagnosis not present

## 2018-06-14 DIAGNOSIS — H5203 Hypermetropia, bilateral: Secondary | ICD-10-CM | POA: Diagnosis not present

## 2018-06-20 NOTE — Telephone Encounter (Signed)
error 

## 2018-07-12 ENCOUNTER — Ambulatory Visit (INDEPENDENT_AMBULATORY_CARE_PROVIDER_SITE_OTHER): Payer: 59 | Admitting: Physician Assistant

## 2018-07-12 ENCOUNTER — Other Ambulatory Visit: Payer: Self-pay

## 2018-07-12 ENCOUNTER — Encounter: Payer: Self-pay | Admitting: Physician Assistant

## 2018-07-12 VITALS — BP 112/70 | HR 85 | Temp 98.0°F | Resp 18 | Ht 63.0 in | Wt 118.8 lb

## 2018-07-12 DIAGNOSIS — N644 Mastodynia: Secondary | ICD-10-CM | POA: Diagnosis not present

## 2018-07-12 DIAGNOSIS — T8544XA Capsular contracture of breast implant, initial encounter: Secondary | ICD-10-CM | POA: Diagnosis not present

## 2018-07-12 NOTE — Patient Instructions (Signed)
° ° ° °  If you have lab work done today you will be contacted with your lab results within the next 2 weeks.  If you have not heard from us then please contact us. The fastest way to get your results is to register for My Chart. ° ° °IF you received an x-ray today, you will receive an invoice from [facility]. Please contact [facility] at [phone] with questions or concerns regarding your invoice.  ° °IF you received labwork today, you will receive an invoice from [facility]. Please contact [facility] at [phone] with questions or concerns regarding your invoice.  ° °Our billing staff will not be able to assist you with questions regarding bills from these companies. ° °You will be contacted with the lab results as soon as they are available. The fastest way to get your results is to activate your My Chart account. Instructions are located on the last page of this paperwork. If you have not heard from us regarding the results in 2 weeks, please contact this office. °  ° ° ° °

## 2018-07-12 NOTE — Progress Notes (Signed)
Amber Williamson  MRN: [id number] DOB: 20-Mar-1961  PCP: Leonie Douglas, PA-C  Chief Complaint  Patient presents with  . Breast Pain    left side breast is burning     Subjective:  Pt presents to clinic for left breast pain. 2 weeks ago lifting boxes and thought she pulled a muscle but now the pain is descretively in her breast.  The breast burns.  The breat is bigger and her nipple has changed.  Last mammogram - 02/2017  History is obtained by patient.  Review of Systems  Constitutional: Negative for chills and fever.    Patient Active Problem List   Diagnosis Date Noted  . Positive colorectal cancer screening using Cologuard test 03/25/2018  . History of SCC (squamous cell carcinoma) of skin 04/11/2017    No current outpatient medications on file prior to visit.   No current facility-administered medications on file prior to visit.     No Known Allergies  Past Medical History:  Diagnosis Date  . Cancer of skin of right leg 2018   RIGHT lower leg, squamous cell carcinoma  . Skin cancer of face 2014   squamous cell carcinoma  . Thyroid disease    hyperthyroidism, took a PTU blocker, resolved   Social History   Social History Narrative   Lives with her husband, who has emphysema.   Adult children live in [place].   Husband's family in [place], [place].   Smoke detectors in home? Yes    Guns in home? No    Consistent seatbelt use? Yes    Consistent dental brushing and flossing? Yes    Semi-Annual dental visits: Yes    Annual Eye exams: Yes    Social History   Tobacco Use  . Smoking status: Former Research scientist (life sciences)  . Smokeless tobacco: Never Used  . Tobacco comment: Nicorette gum since 2004  Substance Use Topics  . Alcohol use: No    Alcohol/week: 0.0 - 1.0 standard drinks    Comment: occasional glass of wine  . Drug use: No   family history includes Atrial fibrillation in her mother; COPD in her father; Healthy in her daughter and son; Heart disease  in her father and mother; Thyroid disease in her mother.     Objective:  BP 112/70   Pulse 85   Temp 98 F (36.7 C) (Oral)   Resp 18   Ht 5\' 3"  (1.6 m)   Wt 118 lb 12.8 oz (53.9 kg)   SpO2 95%   BMI 21.04 kg/m  Body mass index is 21.04 kg/m.  Wt Readings from Last 3 Encounters:  07/12/18 118 lb 12.8 oz (53.9 kg)  02/16/18 116 lb 3.2 oz (52.7 kg)  02/26/17 120 lb (54.4 kg)    Physical Exam  Constitutional: She is oriented to person, place, and time. She appears well-developed and well-nourished.  HENT:  Head: Normocephalic and atraumatic.  Right Ear: Hearing and external ear normal.  Left Ear: Hearing and external ear normal.  Eyes: Conjunctivae are normal.  Neck: Normal range of motion.  Pulmonary/Chest: Effort normal.  Bilateral breast hard c/w encapsulation breast implants - no skin changes though nipple on the left looks different and flatter than the right side.  No mass where patient is having the discomfort.  No rib pain in that area. No rash seen.  Neurological: She is alert and oriented to person, place, and time.  Skin: Skin is warm, dry and intact.  Psychiatric: She has a normal mood  and affect. Her behavior is normal. Judgment and thought content normal.  Vitals reviewed.   Assessment and Plan :  Breast pain, left - Plan: US BREAST COMPLETE UNI LEFT INC AXILLA, US BREAST COMPLETE UNI RIGHT INC AXILLA, MM DIAG BREAST TOMO BILATERAL, CANCELED: MM Digital Diagnostic Bilat  Capsular contracture of breast implant, initial encounter  Called and spoke with Dr Owens Shark at [facility].  Diagnostic mammo - [place] left breast - pt is reluctant as this is what was recommended last time to start with - she would like to have an MRI as that is what she has been told will tell if there is leakage.  Patient verbalized to me that they understand the following: diagnosis, what is being done for them, what to expect and what should be done at home.  Their questions have been  answered.  See after visit summary for patient specific instructions.  Windell Hummingbird PA-C  [facility] 07/16/2018 5:39 AM  Please note: Portions of this report may have been transcribed using dragon voice recognition software. Every effort was made to ensure accuracy; however, inadvertent computerized transcription errors may be present.

## 2018-07-16 ENCOUNTER — Encounter: Payer: Self-pay | Admitting: Physician Assistant

## 2018-07-16 ENCOUNTER — Ambulatory Visit (HOSPITAL_COMMUNITY)
Admission: RE | Admit: 2018-07-16 | Discharge: 2018-07-16 | Disposition: A | Discharge status: Home / Self Care | Payer: 59 | Source: Ambulatory Visit | Attending: Physician Assistant | Admitting: Physician Assistant

## 2018-07-16 ENCOUNTER — Other Ambulatory Visit: Payer: Self-pay | Admitting: Physician Assistant

## 2018-07-16 DIAGNOSIS — T8544XA Capsular contracture of breast implant, initial encounter: Secondary | ICD-10-CM

## 2018-07-16 DIAGNOSIS — N644 Mastodynia: Secondary | ICD-10-CM | POA: Diagnosis not present

## 2018-07-16 DIAGNOSIS — R928 Other abnormal and inconclusive findings on diagnostic imaging of breast: Secondary | ICD-10-CM | POA: Diagnosis not present

## 2018-07-16 DIAGNOSIS — Z9882 Breast implant status: Secondary | ICD-10-CM | POA: Insufficient documentation

## 2018-07-16 MED ORDER — GADOBUTROL 1 MMOL/ML IV SOLN
5.0000 mL | Freq: Once | INTRAVENOUS | Status: AC | PRN
  Administered 2018-07-16: 5 mL via INTRAVENOUS

## 2019-02-27 ENCOUNTER — Encounter: Payer: 59 | Admitting: Family Medicine

## 2019-03-12 ENCOUNTER — Telehealth: Payer: 59 | Admitting: Family Medicine

## 2019-04-28 DIAGNOSIS — Z131 Encounter for screening for diabetes mellitus: Secondary | ICD-10-CM | POA: Diagnosis not present

## 2019-04-28 DIAGNOSIS — R0602 Shortness of breath: Secondary | ICD-10-CM | POA: Diagnosis not present

## 2019-04-28 DIAGNOSIS — E559 Vitamin D deficiency, unspecified: Secondary | ICD-10-CM | POA: Diagnosis not present

## 2019-04-28 DIAGNOSIS — R358 Other polyuria: Secondary | ICD-10-CM | POA: Diagnosis not present

## 2019-04-28 DIAGNOSIS — R5383 Other fatigue: Secondary | ICD-10-CM | POA: Diagnosis not present

## 2019-04-28 DIAGNOSIS — Z114 Encounter for screening for human immunodeficiency virus [HIV]: Secondary | ICD-10-CM | POA: Diagnosis not present

## 2019-04-28 DIAGNOSIS — Z0000 Encounter for general adult medical examination without abnormal findings: Secondary | ICD-10-CM | POA: Diagnosis not present

## 2019-05-12 DIAGNOSIS — Z789 Other specified health status: Secondary | ICD-10-CM | POA: Diagnosis not present

## 2019-05-12 DIAGNOSIS — R51 Headache: Secondary | ICD-10-CM | POA: Diagnosis not present

## 2019-05-12 DIAGNOSIS — Z9189 Other specified personal risk factors, not elsewhere classified: Secondary | ICD-10-CM | POA: Diagnosis not present

## 2019-05-12 DIAGNOSIS — R5383 Other fatigue: Secondary | ICD-10-CM | POA: Diagnosis not present

## 2019-05-16 DIAGNOSIS — Z1212 Encounter for screening for malignant neoplasm of rectum: Secondary | ICD-10-CM | POA: Diagnosis not present

## 2019-05-16 DIAGNOSIS — Z1211 Encounter for screening for malignant neoplasm of colon: Secondary | ICD-10-CM | POA: Diagnosis not present

## 2019-05-16 LAB — COLOGUARD: Cologuard: NEGATIVE

## 2019-07-31 DIAGNOSIS — H5203 Hypermetropia, bilateral: Secondary | ICD-10-CM | POA: Diagnosis not present

## 2020-02-10 DIAGNOSIS — C44722 Squamous cell carcinoma of skin of right lower limb, including hip: Secondary | ICD-10-CM | POA: Diagnosis not present

## 2020-02-10 DIAGNOSIS — L568 Other specified acute skin changes due to ultraviolet radiation: Secondary | ICD-10-CM | POA: Diagnosis not present

## 2020-02-10 DIAGNOSIS — D0471 Carcinoma in situ of skin of right lower limb, including hip: Secondary | ICD-10-CM | POA: Diagnosis not present

## 2020-02-10 DIAGNOSIS — L308 Other specified dermatitis: Secondary | ICD-10-CM | POA: Diagnosis not present

## 2020-02-10 DIAGNOSIS — Z85828 Personal history of other malignant neoplasm of skin: Secondary | ICD-10-CM | POA: Diagnosis not present

## 2020-02-10 DIAGNOSIS — Z08 Encounter for follow-up examination after completed treatment for malignant neoplasm: Secondary | ICD-10-CM | POA: Diagnosis not present

## 2020-02-17 IMAGING — MR MR BILATERAL BREAST WITHOUT AND WITH CONTRAST
9 of 17 series · 21 of 48 positions shown · IV contrast (Yes)
Comparison: 02/07/2010 mammogram

ADDENDUM:
There is equivocal mild edema involving an anterior LEFT rib
INFERIOR to the breast which could represent a
fracture/costochondral injury.

LEFT rib radiographs may be helpful for further evaluation as
clinically indicated.
CLINICAL DATA: 56-year-old female with acute LEFT breast pain
following possible injury. Patient with bilateral silicone implants.
LABS:  None performed today
EXAM:
BILATERAL BREAST MRI WITH AND WITHOUT CONTRAST
TECHNIQUE: Multiplanar, multisequence MR images of both breasts were obtained
prior to and following the intravenous administration of 5 ml of
Gadavist
Three-dimensional MR images were rendered by post-processing the
original MR data using the DynaCAD thin client. The 3D MR images are
interpreted and the findings are included in the complete MRI report
below.

[Series 4: ax ir · axial · 3.0mm · 0.59mm/px · 1 of 64 slices shown]
[im 1/64]
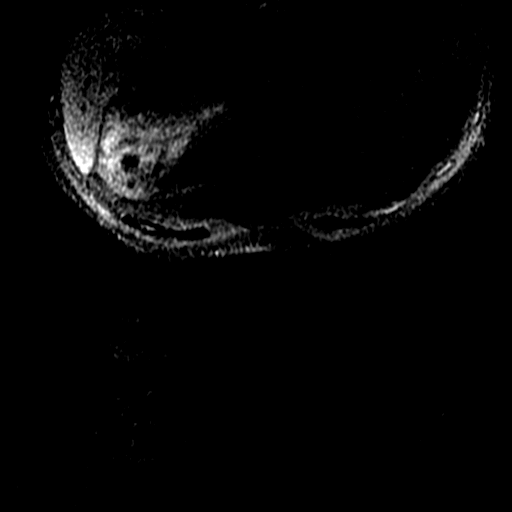

[Series 6: STIR · axial · 3.0mm · 1.17mm/px · 1 of 59 slices shown (1 of 2)]
[im 1/59]
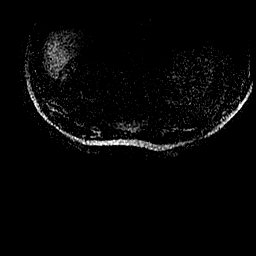

[Series 7: STIR · sagittal · 4.0mm · 0.78mm/px · 1 of 37 slices shown (2 of 2)]
[im 1/37]
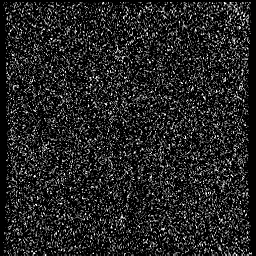

[Series 8: T2 fat-sat · sagittal · 5.0mm · 0.78mm/px · 1 of 25 slices shown]
[im 1/25]
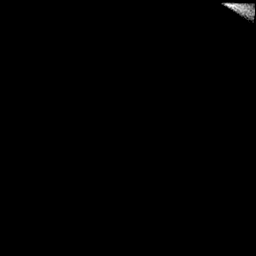

[Series 9: sag vibrant unilat · sagittal · 3.0mm · 0.78mm/px · 1 of 56 slices shown]
[im 1/56]
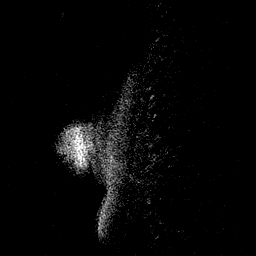

[Series 1000: 5ml vibrant mph · axial · 1.8mm · 0.59mm/px · z∈[-158,+21]mm · 4 of 200 slices shown]
[im 1/200]
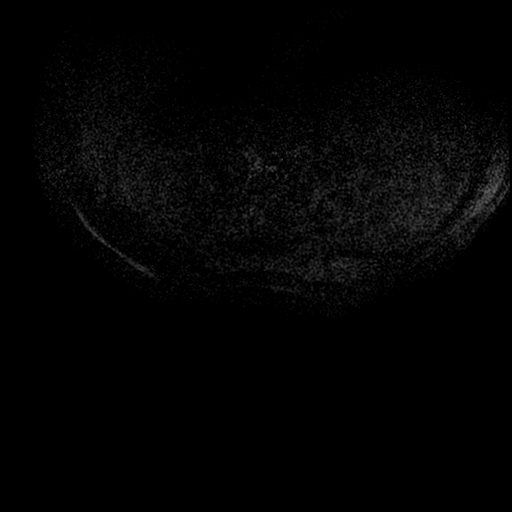
[im 67/200]
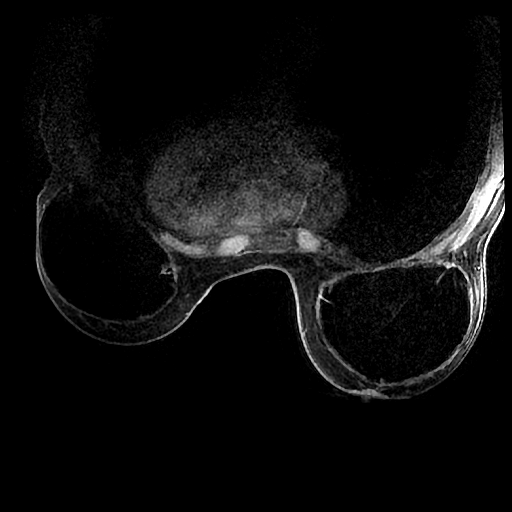
[im 133/200]
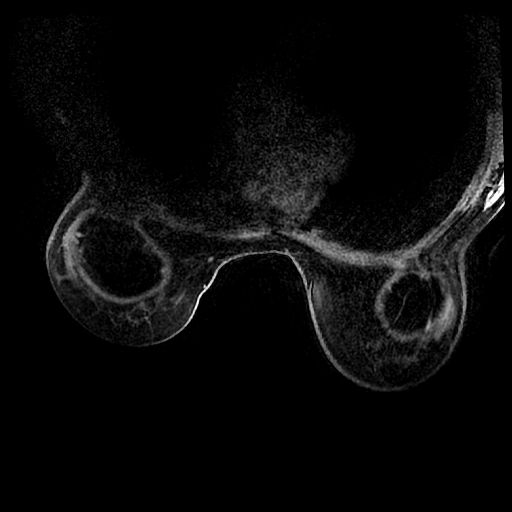
[im 200/200]
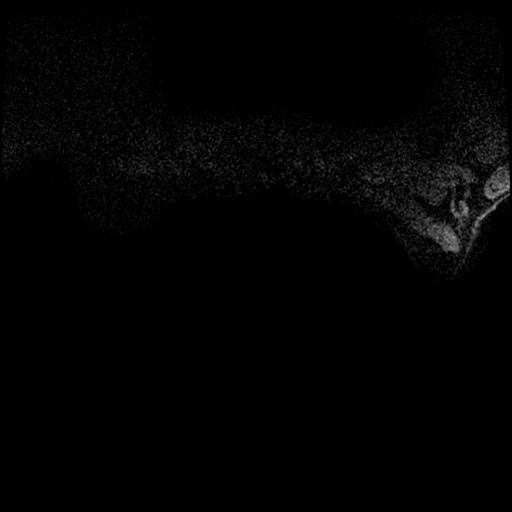

[Series 1001: ph1/5ml vibrant mph · axial · 1.8mm · 0.59mm/px · z∈[-158,+21]mm · 4 of 200 slices shown]
[im 1/200]
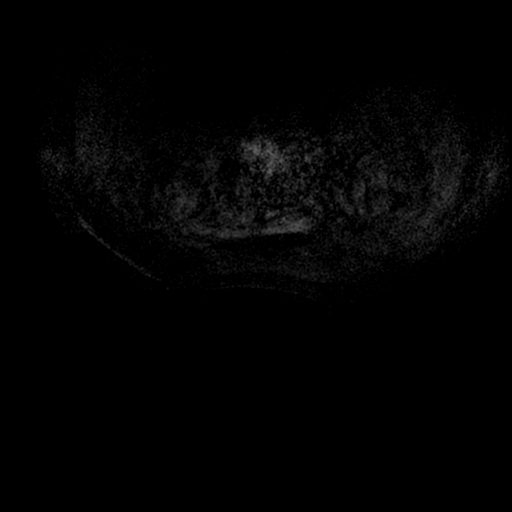
[im 67/200]
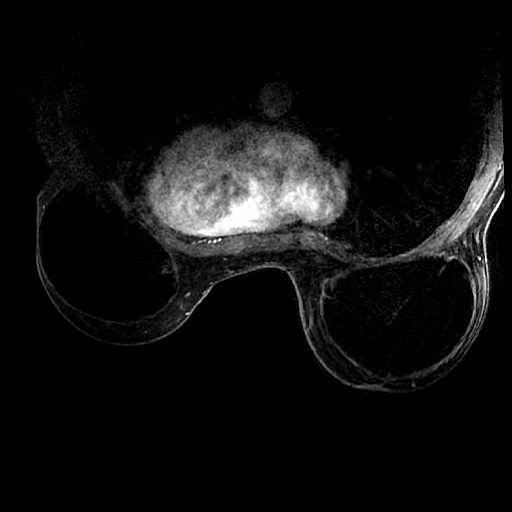
[im 133/200]
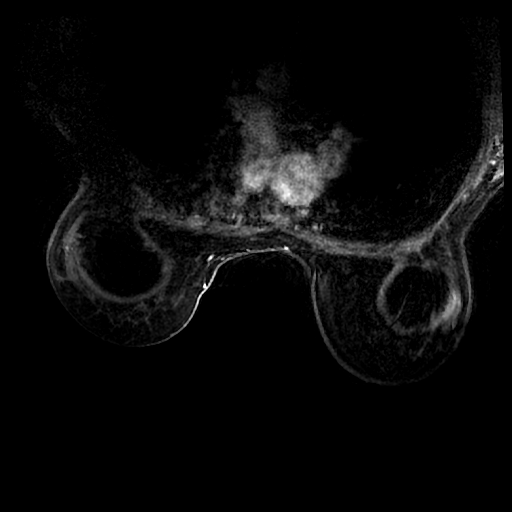
[im 200/200]
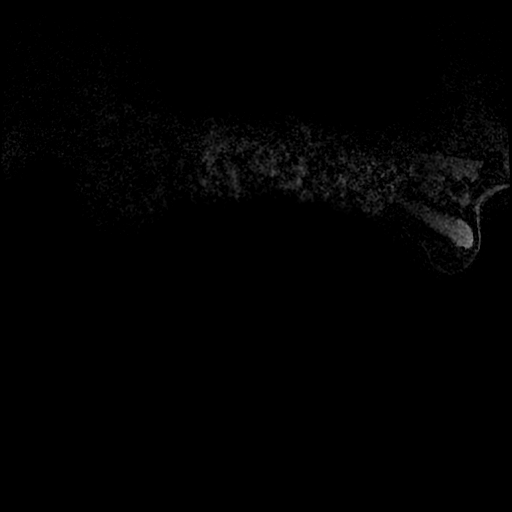

[Series 1002: ph2/5ml vibrant mph · axial · 1.8mm · 0.59mm/px · z∈[-158,+21]mm · 4 of 200 slices shown]
[im 1/200]
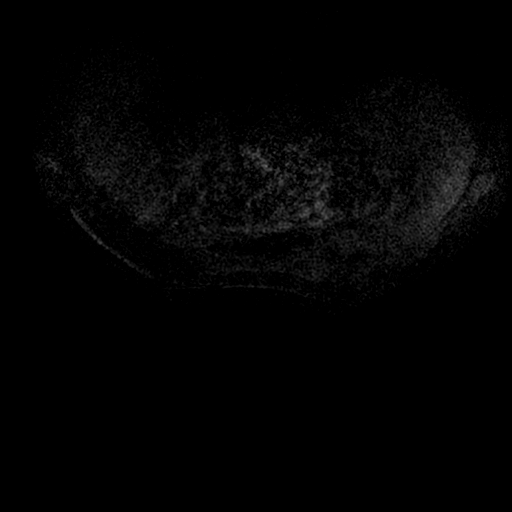
[im 67/200]
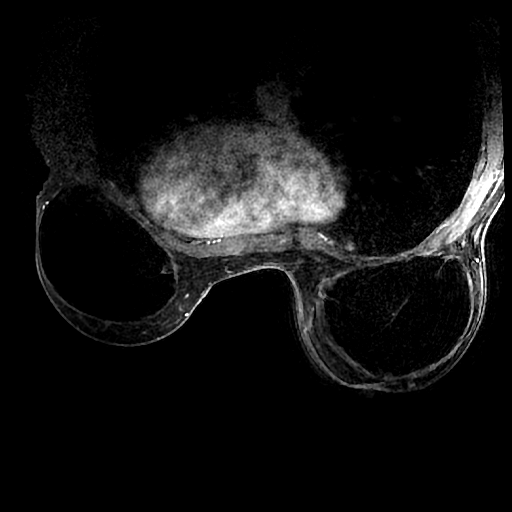
[im 133/200]
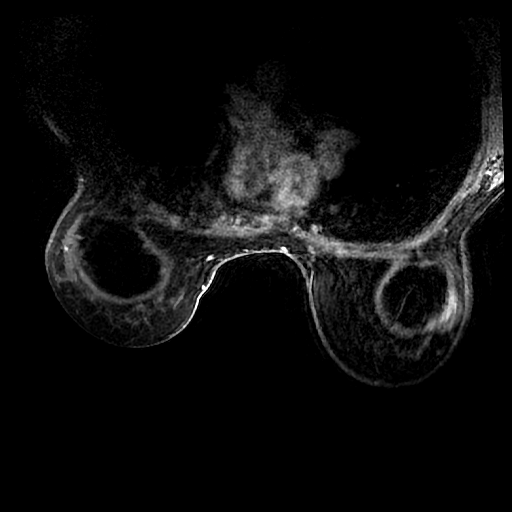
[im 200/200]
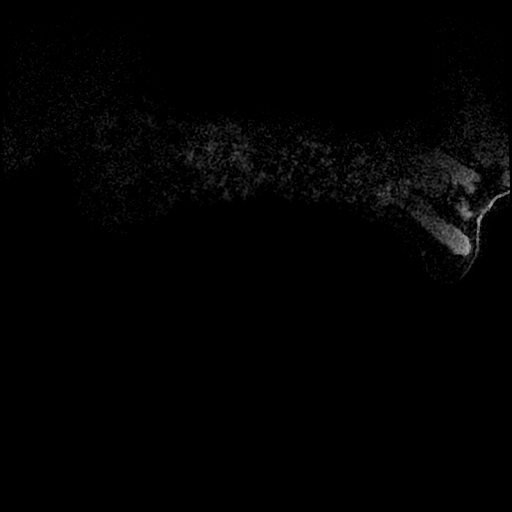

[Series 1003: ph3/5ml vibrant mph · axial · 1.8mm · 0.59mm/px · z∈[-158,-24]mm · 4 of 200 slices shown]
[im 1/200]
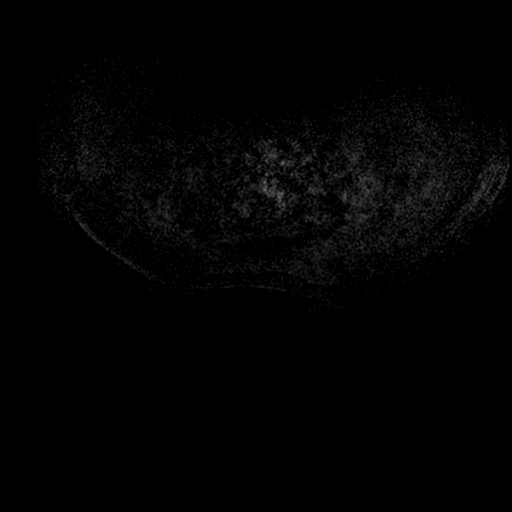
[im 50/200]
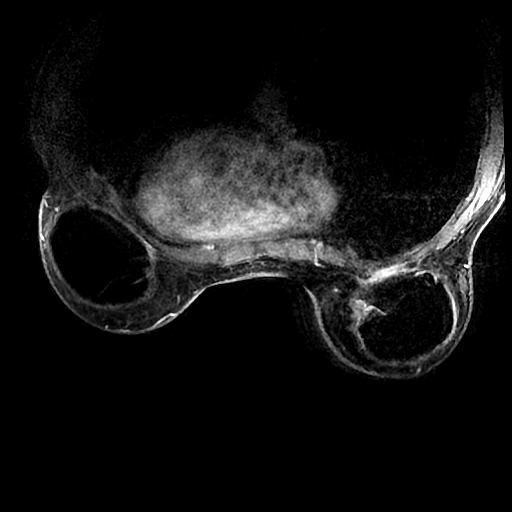
[im 100/200]
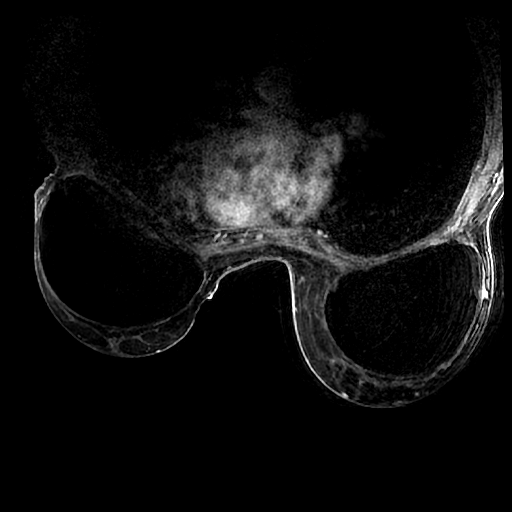
[im 150/200]
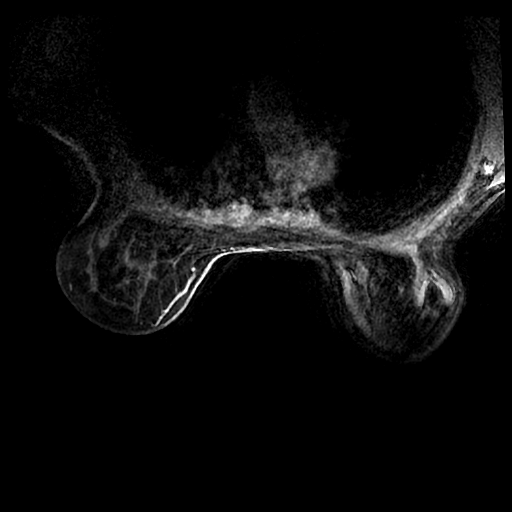

[21 of 48 positions shown; findings below may reference images not displayed]

FINDINGS: Breast composition: a. Almost entirely fat.

Background parenchymal enhancement: Minimal

Right breast: A RIGHT retropectoral implant is noted. There is
minimal irregularity along the periphery of the LOWER INNER implant
which could represent a slightly irregular radial fold versus small
intracapsular implant rupture, but not well delineated on this
study. There is no evidence of extracapsular rupture. No mass or
suspicious enhancement.

Left breast: A RIGHT retropectoral implant is noted without rupture.
No mass or abnormal enhancement.

Lymph nodes: No abnormal appearing lymph nodes.

Ancillary findings:  None.
IMPRESSION: 1. No MR evidence of breast malignancy.
2. Minimal irregularity along the LOWER INNER RIGHT retropectoral
implant. This may represent a slightly irregular radial fold versus
small intracapsular rupture. No evidence of RIGHT extracapsular
implant rupture.
3. Unremarkable LEFT breast implant - no rupture.

RECOMMENDATION:
Annual screening mammogram

BI-RADS CATEGORY  1: Negative.

## 2020-04-09 DIAGNOSIS — Z1231 Encounter for screening mammogram for malignant neoplasm of breast: Secondary | ICD-10-CM | POA: Diagnosis not present

## 2020-04-09 DIAGNOSIS — Z85828 Personal history of other malignant neoplasm of skin: Secondary | ICD-10-CM | POA: Diagnosis not present

## 2020-07-07 ENCOUNTER — Other Ambulatory Visit: Payer: Self-pay

## 2020-07-07 ENCOUNTER — Ambulatory Visit: Payer: 59 | Admitting: Registered Nurse

## 2020-07-07 DIAGNOSIS — R079 Chest pain, unspecified: Secondary | ICD-10-CM

## 2020-07-07 DIAGNOSIS — R002 Palpitations: Secondary | ICD-10-CM

## 2020-07-07 NOTE — Patient Instructions (Signed)
° ° ° °  If you have lab work done today you will be contacted with your lab results within the next 2 weeks.  If you have not heard from us then please contact us. The fastest way to get your results is to register for My Chart. ° ° °IF you received an x-ray today, you will receive an invoice from [facility]. Please contact [facility] at [phone] with questions or concerns regarding your invoice.  ° °IF you received labwork today, you will receive an invoice from [facility]. Please contact [facility] at [phone] with questions or concerns regarding your invoice.  ° °Our billing staff will not be able to assist you with questions regarding bills from these companies. ° °You will be contacted with the lab results as soon as they are available. The fastest way to get your results is to activate your My Chart account. Instructions are located on the last page of this paperwork. If you have not heard from us regarding the results in 2 weeks, please contact this office. °  ° ° ° °

## 2020-07-08 LAB — C-REACTIVE PROTEIN: CRP: <1 mg/L (ref 0–10)

## 2020-07-08 LAB — CBC WITH DIFFERENTIAL
Basophils Absolute: 0 10*3/uL (ref 0.0–0.2)
Basos: 0 %
EOS (ABSOLUTE): 0.2 10*3/uL (ref 0.0–0.4)
Eos: 3 %
Hematocrit: 39.4 % (ref 34.0–46.6)
Hemoglobin: 13.2 g/dL (ref 11.1–15.9)
Immature Grans (Abs): 0 10*3/uL (ref 0.0–0.1)
Immature Granulocytes: 0 %
Lymphocytes Absolute: 1.8 10*3/uL (ref 0.7–3.1)
Lymphs: 35 %
MCH: 31.1 pg (ref 26.6–33.0)
MCHC: 33.5 g/dL (ref 31.5–35.7)
MCV: 93 fL (ref 79–97)
Monocytes Absolute: 0.6 10*3/uL (ref 0.1–0.9)
Monocytes: 11 %
Neutrophils Absolute: 2.6 10*3/uL (ref 1.4–7.0)
Neutrophils: 51 %
RBC: 4.25 x10E6/uL (ref 3.77–5.28)
RDW: 12 % (ref 11.7–15.4)
WBC: 5.1 10*3/uL (ref 3.4–10.8)

## 2020-07-08 LAB — BASIC METABOLIC PANEL
BUN/Creatinine Ratio: 24 (ref 12–28)
BUN: 16 mg/dL (ref 8–27)
CO2: 25 mmol/L (ref 20–29)
Calcium: 9.9 mg/dL (ref 8.7–10.3)
Chloride: 104 mmol/L (ref 96–106)
Creatinine, Ser: 0.67 mg/dL (ref 0.57–1.00)
GFR calc Af Amer: 110 mL/min/{1.73_m2} (ref >59)
GFR calc non Af Amer: 96 mL/min/{1.73_m2} (ref >59)
Glucose: 82 mg/dL (ref 65–99)
Potassium: 4.3 mmol/L (ref 3.5–5.2)
Sodium: 142 mmol/L (ref 134–144)

## 2020-07-08 LAB — THYROID PANEL WITH TSH
Free Thyroxine Index: 2.2 (ref 1.2–4.9)
T3 Uptake Ratio: 28 % (ref 24–39)
T4, Total: 8 ug/dL (ref 4.5–12.0)
TSH: 2.22 u[IU]/mL (ref 0.450–4.500)

## 2020-07-08 LAB — SEDIMENTATION RATE: Sed Rate: 3 mm/hr (ref 0–40)

## 2020-07-11 ENCOUNTER — Encounter: Payer: Self-pay | Admitting: Registered Nurse

## 2020-07-30 DIAGNOSIS — H524 Presbyopia: Secondary | ICD-10-CM | POA: Diagnosis not present

## 2020-07-30 DIAGNOSIS — Z0000 Encounter for general adult medical examination without abnormal findings: Secondary | ICD-10-CM | POA: Diagnosis not present

## 2020-07-30 DIAGNOSIS — Z124 Encounter for screening for malignant neoplasm of cervix: Secondary | ICD-10-CM | POA: Diagnosis not present

## 2020-07-30 LAB — HM PAP SMEAR: HM Pap smear: NEGATIVE

## 2020-08-02 ENCOUNTER — Other Ambulatory Visit: Payer: Self-pay | Admitting: Physician Assistant

## 2020-08-02 DIAGNOSIS — Z1231 Encounter for screening mammogram for malignant neoplasm of breast: Secondary | ICD-10-CM

## 2020-08-03 ENCOUNTER — Encounter: Payer: Self-pay | Admitting: Registered Nurse

## 2020-08-03 NOTE — Progress Notes (Signed)
Acute Office Visit  Subjective:    Patient ID: Amber Williamson, female    DOB: [birth year], 59 y.o.   MRN: [id number]  Chief Complaint  Patient presents with   Chest Pain    ALL on LEFT side - per patient it started lasted Wed pain in center of the chest,Thursday unable to raise left arm-painful     HPI Patient is in today for L side chest pain Feeling like she is having some palpitations as well Sometimes a sharp pain with a deep breath No lightheadedness, dizziness, shob, doe, claudication, or dependent edema At times pain radiates down L arm Feeling anxious as to the etiology of this pain  Past Medical History:  Diagnosis Date   Cancer of skin of right leg 2018   RIGHT lower leg, squamous cell carcinoma   Skin cancer of face 2014   squamous cell carcinoma   Thyroid disease    hyperthyroidism, took a PTU blocker, resolved    Past Surgical History:  Procedure Laterality Date   BREAST SURGERY     augmentation   SKIN CANCER EXCISION     Forehead 2014, RIGHT lower leg 2018; BCC    Family History  Problem Relation Age of Onset   Heart disease Mother    Atrial fibrillation Mother    Thyroid disease Mother        hypothyroidism   Heart disease Father    COPD Father    Healthy Daughter    Healthy Son     Social History   Socioeconomic History   Marital status: Married    Spouse name: Cory Roughen   Number of children: 2   Years of education: Not on file   Highest education level: 12th grade  Occupational History   Occupation: Development worker, community: [place]    Comment: Primary Care at [employer]  Tobacco Use   Smoking status: Former Smoker   Smokeless tobacco: Never Used   Tobacco comment: Nicorette gum since 2004  Vaping Use   Vaping Use: Never used  Substance and Sexual Activity   Alcohol use: No    Alcohol/week: 0.0 - 1.0 standard drinks    Comment: occasional glass of wine   Drug use: No   Sexual activity: Yes     Partners: Male    Birth control/protection: Post-menopausal  Other Topics Concern   Not on file  Social History Narrative   Lives with her husband, who has emphysema.   Adult children live in [place].   Husband's family in [place], [place].   Smoke detectors in home? Yes    Guns in home? No    Consistent seatbelt use? Yes    Consistent dental brushing and flossing? Yes    Semi-Annual dental visits: Yes    Annual Eye exams: Yes    Social Determinants of Health   Financial Resource Strain:    Difficulty of Paying Living Expenses: Not on file  Food Insecurity:    Worried About [place] [facility] in the Last Year: Not on file   Ran Out of Food in the Last Year: Not on file  Transportation Needs:    Lack of Transportation (Medical): Not on file   Lack of Transportation (Non-Medical): Not on file  Physical Activity:    Days of Exercise per Week: Not on file   Minutes of Exercise per Session: Not on file  Stress:    Feeling of Stress : Not on file  Social Connections:  Frequency of Communication with Friends and Family: Not on file   Frequency of Social Gatherings with Friends and Family: Not on file   Attends Religious Services: Not on file   Active Member of Clubs or Organizations: Not on file   Attends Archivist Meetings: Not on file   Marital Status: Not on file  Intimate Partner Violence:    Fear of Current or Ex-Partner: Not on file   Emotionally Abused: Not on file   Physically Abused: Not on file   Sexually Abused: Not on file    No outpatient medications prior to visit.   No facility-administered medications prior to visit.    No Known Allergies  Review of Systems  Constitutional: Negative.   HENT: Negative.   Eyes: Negative.   Respiratory: Positive for chest tightness.   Cardiovascular: Positive for chest pain.  Gastrointestinal: Negative.   Genitourinary: Negative.   Musculoskeletal: Negative.   Skin: Negative.     Neurological: Negative.   Psychiatric/Behavioral: Negative.        Objective:    Physical Exam Vitals reviewed.  Constitutional:      General: She is not in acute distress.    Appearance: She is well-developed. She is not ill-appearing, toxic-appearing or diaphoretic.  Cardiovascular:     Heart sounds: Normal heart sounds.  Pulmonary:     Breath sounds: Normal breath sounds.  Skin:    General: Skin is warm and dry.  Neurological:     General: No focal deficit present.     Mental Status: She is alert and oriented to person, place, and time.  Psychiatric:        Mood and Affect: Mood normal. Mood is not anxious.        Behavior: Behavior normal. Behavior is not agitated.     There were no vitals taken for this visit. Wt Readings from Last 3 Encounters:  07/12/18 118 lb 12.8 oz (53.9 kg)  02/16/18 116 lb 3.2 oz (52.7 kg)  02/26/17 120 lb (54.4 kg)    Health Maintenance Due  Topic Date Due   PAP SMEAR-Modifier  Never done    There are no preventive care reminders to display for this patient.   Lab Results  Component Value Date   TSH 2.220 07/07/2020   Lab Results  Component Value Date   WBC 5.1 07/07/2020   HGB 13.2 07/07/2020   HCT 39.4 07/07/2020   MCV 93 07/07/2020   PLT 254 02/16/2018   Lab Results  Component Value Date   NA 142 07/07/2020   K 4.3 07/07/2020   CO2 25 07/07/2020   GLUCOSE 82 07/07/2020   BUN 16 07/07/2020   CREATININE 0.67 07/07/2020   BILITOT 0.5 02/16/2018   ALKPHOS 77 02/16/2018   AST 20 02/16/2018   ALT 15 02/16/2018   PROT 7.0 02/16/2018   ALBUMIN 4.7 02/16/2018   CALCIUM 9.9 07/07/2020   Lab Results  Component Value Date   CHOL 191 02/16/2018   Lab Results  Component Value Date   HDL 72 02/16/2018   Lab Results  Component Value Date   LDLCALC 110 (H) 02/16/2018   Lab Results  Component Value Date   TRIG 46 02/16/2018   Lab Results  Component Value Date   CHOLHDL 2.7 02/16/2018   No results found for:  HGBA1C     Assessment & Plan:   Problem List Items Addressed This Visit    None    Visit Diagnoses    Palpitations    -  Primary   Relevant Orders   EKG 12-Lead (Completed)   Thyroid Panel With TSH (Completed)   CBC With Differential (Completed)   Basic Metabolic Panel (Completed)   Sedimentation Rate (Completed)   C-reactive protein (Completed)   Chest pain, unspecified type       Relevant Orders   Thyroid Panel With TSH (Completed)   CBC With Differential (Completed)   Basic Metabolic Panel (Completed)   Sedimentation Rate (Completed)   C-reactive protein (Completed)       No orders of the defined types were placed in this encounter.  PLAN  EKG reviewed. No acute changes. No previous study available  Suspect msk cause - likely costochondritis, but will draw some labs first to rule out other less common causes  Pt has upcoming appt with PCP, suggest follow up at that time or with myself sooner if symptoms change or progress.  Patient encouraged to call clinic with any questions, comments, or concerns.   Maximiano Coss, NP

## 2021-08-05 DIAGNOSIS — H5203 Hypermetropia, bilateral: Secondary | ICD-10-CM | POA: Diagnosis not present

## 2021-08-05 DIAGNOSIS — Z0000 Encounter for general adult medical examination without abnormal findings: Secondary | ICD-10-CM | POA: Diagnosis not present

## 2023-02-26 DIAGNOSIS — Z87891 Personal history of nicotine dependence: Secondary | ICD-10-CM | POA: Diagnosis not present

## 2023-02-26 DIAGNOSIS — Z85828 Personal history of other malignant neoplasm of skin: Secondary | ICD-10-CM | POA: Diagnosis not present

## 2023-02-26 DIAGNOSIS — Z814 Family history of other substance abuse and dependence: Secondary | ICD-10-CM | POA: Diagnosis not present

## 2023-02-26 DIAGNOSIS — Z8249 Family history of ischemic heart disease and other diseases of the circulatory system: Secondary | ICD-10-CM | POA: Diagnosis not present

## 2023-03-05 DIAGNOSIS — H019 Unspecified inflammation of eyelid: Secondary | ICD-10-CM | POA: Diagnosis not present

## 2023-03-27 DIAGNOSIS — H11433 Conjunctival hyperemia, bilateral: Secondary | ICD-10-CM | POA: Diagnosis not present

## 2023-06-08 ENCOUNTER — Ambulatory Visit: Payer: 59 | Admitting: Physician Assistant

## 2023-06-08 ENCOUNTER — Encounter: Payer: Self-pay | Admitting: Physician Assistant

## 2023-06-08 VITALS — BP 132/82 | HR 73 | Temp 98.7°F | Resp 20 | Ht 63.0 in | Wt 110.0 lb

## 2023-06-08 DIAGNOSIS — Z8639 Personal history of other endocrine, nutritional and metabolic disease: Secondary | ICD-10-CM

## 2023-06-08 DIAGNOSIS — L290 Pruritus ani: Secondary | ICD-10-CM

## 2023-06-08 DIAGNOSIS — Z0000 Encounter for general adult medical examination without abnormal findings: Secondary | ICD-10-CM

## 2023-06-08 DIAGNOSIS — Z0001 Encounter for general adult medical examination with abnormal findings: Secondary | ICD-10-CM | POA: Diagnosis not present

## 2023-06-08 DIAGNOSIS — L299 Pruritus, unspecified: Secondary | ICD-10-CM | POA: Diagnosis not present

## 2023-06-08 DIAGNOSIS — Z1231 Encounter for screening mammogram for malignant neoplasm of breast: Secondary | ICD-10-CM

## 2023-06-08 DIAGNOSIS — Z1211 Encounter for screening for malignant neoplasm of colon: Secondary | ICD-10-CM

## 2023-06-08 LAB — COMPREHENSIVE METABOLIC PANEL
ALT: 18 U/L (ref 0–35)
AST: 20 U/L (ref 0–37)
Albumin: 4.5 g/dL (ref 3.5–5.2)
Alkaline Phosphatase: 65 U/L (ref 39–117)
BUN: 16 mg/dL (ref 6–23)
CO2: 27 mEq/L (ref 19–32)
Calcium: 9.8 mg/dL (ref 8.4–10.5)
Chloride: 107 mEq/L (ref 96–112)
Creatinine, Ser: 0.62 mg/dL (ref 0.40–1.20)
GFR: 94.48 mL/min (ref >60.00)
Glucose, Bld: 80 mg/dL (ref 70–99)
Potassium: 4.3 mEq/L (ref 3.5–5.1)
Sodium: 142 mEq/L (ref 135–145)
Total Bilirubin: 0.7 mg/dL (ref 0.2–1.2)
Total Protein: 6.6 g/dL (ref 6.0–8.3)

## 2023-06-08 LAB — CBC WITH DIFFERENTIAL/PLATELET
Basophils Absolute: 0 10*3/uL (ref 0.0–0.1)
Basophils Relative: 0.8 % (ref 0.0–3.0)
Eosinophils Absolute: 0.1 10*3/uL (ref 0.0–0.7)
Eosinophils Relative: 1.9 % (ref 0.0–5.0)
HCT: 41.8 % (ref 36.0–46.0)
Hemoglobin: 13.7 g/dL (ref 12.0–15.0)
Lymphocytes Relative: 27.2 % (ref 12.0–46.0)
Lymphs Abs: 1.1 10*3/uL (ref 0.7–4.0)
MCHC: 32.7 g/dL (ref 30.0–36.0)
MCV: 94.9 fl (ref 78.0–100.0)
Monocytes Absolute: 0.4 10*3/uL (ref 0.1–1.0)
Monocytes Relative: 9.8 % (ref 3.0–12.0)
Neutro Abs: 2.5 10*3/uL (ref 1.4–7.7)
Neutrophils Relative %: 60.3 % (ref 43.0–77.0)
Platelets: 226 10*3/uL (ref 150.0–400.0)
RBC: 4.4 Mil/uL (ref 3.87–5.11)
RDW: 13.4 % (ref 11.5–15.5)
WBC: 4.1 10*3/uL (ref 4.0–10.5)

## 2023-06-08 LAB — LIPID PANEL
Cholesterol: 196 mg/dL (ref 0–200)
HDL: 85.3 mg/dL (ref >39.00)
LDL Cholesterol: 100 mg/dL — ABNORMAL HIGH (ref 0–99)
NonHDL: 110.83
Total CHOL/HDL Ratio: 2
Triglycerides: 55 mg/dL (ref 0.0–149.0)
VLDL: 11 mg/dL (ref 0.0–40.0)

## 2023-06-08 NOTE — Progress Notes (Signed)
New patient visit   Patient: Amber Williamson   DOB: Oct 26, 1961   62 y.o. Female  MRN: [id number] Visit Date: 06/08/2023  Today's healthcare provider: Alfredia Ferguson, PA-C   Chief Complaint  Patient presents with   Annual Exam   Subjective    Amber Williamson is a 62 y.o. female who presents today as a new patient to establish care.  HPI  Discussed the use of AI scribe software for clinical note transcription with the patient, who gave verbal consent to proceed.  History of Present Illness   The patient, with a past medical history of thyroid issues and skin cancers, presents for a routine physical examination.    They report experiencing anal itching, which they have been managing with a steroid-free cream. The itching is not constant but flares up occasionally. They deny any changes in bowel movements or presence of blood in their stool. H/o of positive cologuard, was recommended colonoscopy but instead just repeated cologuard in a year. Last was 2020, negative.   The patient also reports a rash under both arms, which they initially thought was a bug bite. They have tried switching deodorants, including using a more natural brand, but the rash persists. Additionally, the patient has been experiencing inflammation in their eyes, which has led to multiple visits to the eye doctor and urgent care. They have spent a significant amount of money on products to manage this issue. The patient also mentions a history of being the "rashy kid" and having dry skin.       Past Medical History:  Diagnosis Date   Cancer of skin of right leg 2018   RIGHT lower leg, squamous cell carcinoma   Skin cancer of face 2014   squamous cell carcinoma   Thyroid disease    hyperthyroidism, took a PTU blocker, resolved   Past Surgical History:  Procedure Laterality Date   BREAST SURGERY     augmentation   SKIN CANCER EXCISION     Forehead 2014, RIGHT lower leg 2018; BCC   Family Status   Relation Name Status   Mother  Deceased   Father  Deceased   Brother  Deceased at age 59       opioid related death   Daughter  Alive   Son  Alive  No partnership data on file   Family History  Problem Relation Age of Onset   Heart disease Mother    Atrial fibrillation Mother    Thyroid disease Mother        hypothyroidism   Heart disease Father    COPD Father    Healthy Daughter    Healthy Son    Social History   Socioeconomic History   Marital status: Married    Spouse name: Donald Pore   Number of children: 2   Years of education: Not on file   Highest education level: 12th grade  Occupational History   Occupation: Administrator, Civil Service: [employer]    Comment: Primary Care at [employer]  Tobacco Use   Smoking status: Former    Current packs/day: 0.00    Average packs/day: 1 pack/day for 28.0 years (28.0 ttl pk-yrs)    Types: Cigarettes    Start date: 55    Quit date: 2009    Years since quitting: 15.5   Smokeless tobacco: Never   Tobacco comments:    Nicorette gum since 2004  Vaping Use   Vaping status: Never Used  Substance and  Sexual Activity   Alcohol use: No    Alcohol/week: 0.0 - 1.0 standard drinks of alcohol    Comment: occasional glass of wine   Drug use: No   Sexual activity: Yes    Partners: Male    Birth control/protection: Post-menopausal  Other Topics Concern   Not on file  Social History Narrative   Lives with her husband, who has emphysema.   Adult children live in [place].   Husband's family in [place], [place].   Smoke detectors in home? Yes    Guns in home? No    Consistent seatbelt use? Yes    Consistent dental brushing and flossing? Yes    Semi-Annual dental visits: Yes    Annual Eye exams: Yes    Social Determinants of Health   Financial Resource Strain: Low Risk  (02/16/2018)   Overall Financial Resource Strain (CARDIA)    Difficulty of Paying Living Expenses: Not hard at all  Food Insecurity: No Food Insecurity  (08/05/2021)   [facility]   Hunger Vital Sign    Worried About Running Out of Food in the Last Year: Never true    Ran Out of Food in the Last Year: Never true  Transportation Needs: No Transportation Needs (02/16/2018)   PRAPARE - Administrator, Civil Service (Medical): No    Lack of Transportation (Non-Medical): No  Physical Activity: Inactive (02/16/2018)   Exercise Vital Sign    Days of Exercise per Week: 0 days    Minutes of Exercise per Session: 0 min  Stress: Stress Concern Present (02/16/2018)   [employer] of Occupational Health - Occupational Stress Questionnaire    Feeling of Stress : To some extent  Social Connections: Unknown (03/21/2022)   Received from [facility]   Social Network    Social Network: Not on file   Outpatient Medications Prior to Visit  Medication Sig   EYSUVIS 0.25 % SUSP Apply 1 drop to eye 3 (three) times daily.   No facility-administered medications prior to visit.   Allergies  Allergen Reactions   Other Rash    Immunization History  Administered Date(s) Administered   Influenza Inj Mdck Quad Pf 07/09/2018   Influenza-Unspecified 07/31/2013, 08/04/2021   PFIZER(Purple Top)SARS-COV-2 Vaccination 06/04/2020, 07/02/2020   Tdap 03/28/2020    Health Maintenance  Topic Date Due   PAP SMEAR-Modifier  Never done   Lung Cancer Screening  Never done   Zoster Vaccines- Shingrix (1 of 2) Never done   MAMMOGRAM  07/16/2020   Fecal DNA (Cologuard)  05/15/2022   COVID-19 Vaccine (3 - 2023-24 season) 07/07/2022   INFLUENZA VACCINE  06/07/2023   DTaP/Tdap/Td (2 - Td or Tdap) 03/28/2030   Hepatitis C Screening  Completed   HIV Screening  Completed   HPV VACCINES  Aged Out    Patient Care Team: Alfredia Ferguson, PA-C as PCP - General (Physician Assistant) Nita Sells, MD (Dermatology)  Review of Systems  Constitutional:  Negative for fatigue and fever.  Respiratory:  Negative for cough and shortness of breath.    Cardiovascular:  Negative for chest pain and leg swelling.  Gastrointestinal:  Negative for abdominal pain.  Skin:  Positive for rash.  Neurological:  Negative for dizziness and headaches.       Objective    BP 132/82 (BP Location: Left Arm, Patient Position: Sitting, Cuff Size: Small)   Pulse 73   Temp 98.7 F (37.1 C) (Oral)   Resp 20   Ht 5'  3" (1.6 m)   Wt 110 lb (49.9 kg)   SpO2 97%   BMI 19.49 kg/m    Physical Exam Constitutional:      General: She is awake.     Appearance: She is well-developed. She is not ill-appearing.  HENT:     Head: Normocephalic.     Right Ear: Tympanic membrane normal.     Left Ear: Tympanic membrane normal.     Nose: Nose normal. No congestion or rhinorrhea.     Mouth/Throat:     Pharynx: No oropharyngeal exudate or posterior oropharyngeal erythema.  Eyes:     Conjunctiva/sclera: Conjunctivae normal.     Pupils: Pupils are equal, round, and reactive to light.  Neck:     Thyroid: No thyroid mass or thyromegaly.  Cardiovascular:     Rate and Rhythm: Normal rate and regular rhythm.     Heart sounds: Normal heart sounds.  Pulmonary:     Effort: Pulmonary effort is normal.     Breath sounds: Normal breath sounds.  Chest:     Comments: B/l axillas without erythema, rashes Abdominal:     Palpations: Abdomen is soft.     Tenderness: There is no abdominal tenderness.  Genitourinary:    Rectum: Normal.     Comments: No visible hemorrhoids, some extra skin folds, either age related or from prior hemorrhoids  Musculoskeletal:     Right lower leg: No swelling. No edema.     Left lower leg: No swelling. No edema.  Lymphadenopathy:     Cervical: No cervical adenopathy.  Skin:    General: Skin is warm.  Neurological:     Mental Status: She is alert and oriented to person, place, and time.  Psychiatric:        Attention and Perception: Attention normal.        Mood and Affect: Mood normal.        Speech: Speech normal.        Behavior:  Behavior normal. Behavior is cooperative.     Depression Screen    06/08/2023    8:28 AM 02/26/2017    5:07 PM  PHQ 2/9 Scores  PHQ - 2 Score 0 0   Results for orders placed or performed in visit on 06/08/23  HM MAMMOGRAPHY  Result Value Ref Range   HM Mammogram 0-4 Bi-Rad 0-4 Bi-Rad, Self Reported Normal  Cologuard  Result Value Ref Range   Cologuard Negative Negative    Assessment & Plan     1. Annual physical exam - CBC w/Diff - Comp Met (CMET) - Lipid panel - T4 AND TSH  2. Anal pruritus -May just be from incomplete cleaning d/t excessive skin folding. Recommending bidet , attention, prn witch hazel wipes otc.   3. Pruritus For underarms, no rashes visible, her skin is fairly taught from breast implants, may be hypersensitivity. Recommending heat, chest stretches.  Can try topical aquaphor/prn hydrocortisone otc  4. Breast cancer screening by mammogram - MS 3D SCR BILAT BR W/IMPLANTS (aka MM); Future  5. History of hyperthyroidism H/o ptu use, reports stability for years.  - T4 AND TSH  6. Colon cancer screening Positive Cologuard in 2019, negative in 2020.  -Order Cologuard for current year. If positive, recommend colonoscopy. Advised colonoscopy is gold standard, and will always be recommended as our first choice cancer screening.   Pt prefers to d/c pap smears. Last was 2021, hpv cytology negative.   Return in about 1 year (around 06/07/2024) for CPE.  I, Alfredia Ferguson, PA-C have reviewed all documentation for this visit. The documentation on  06/08/23   for the exam, diagnosis, procedures, and orders are all accurate and complete.    Alfredia Ferguson, PA-C  [facility] [phone] (phone) [phone] (fax)  [facility]

## 2023-06-14 DIAGNOSIS — Z1211 Encounter for screening for malignant neoplasm of colon: Secondary | ICD-10-CM | POA: Diagnosis not present

## 2023-09-11 DIAGNOSIS — Z681 Body mass index (BMI) 19 or less, adult: Secondary | ICD-10-CM | POA: Diagnosis not present

## 2023-09-11 DIAGNOSIS — H00025 Hordeolum internum left lower eyelid: Secondary | ICD-10-CM | POA: Diagnosis not present

## 2023-10-10 DIAGNOSIS — M25572 Pain in left ankle and joints of left foot: Secondary | ICD-10-CM | POA: Diagnosis not present

## 2023-10-15 DIAGNOSIS — L84 Corns and callosities: Secondary | ICD-10-CM | POA: Diagnosis not present

## 2023-10-15 DIAGNOSIS — M2042 Other hammer toe(s) (acquired), left foot: Secondary | ICD-10-CM | POA: Diagnosis not present

## 2023-10-15 DIAGNOSIS — M216X9 Other acquired deformities of unspecified foot: Secondary | ICD-10-CM | POA: Diagnosis not present

## 2023-10-15 DIAGNOSIS — L565 Disseminated superficial actinic porokeratosis (DSAP): Secondary | ICD-10-CM | POA: Diagnosis not present

## 2023-10-15 DIAGNOSIS — M792 Neuralgia and neuritis, unspecified: Secondary | ICD-10-CM | POA: Diagnosis not present

## 2023-10-15 DIAGNOSIS — M2041 Other hammer toe(s) (acquired), right foot: Secondary | ICD-10-CM | POA: Diagnosis not present

## 2023-10-15 DIAGNOSIS — M898X9 Other specified disorders of bone, unspecified site: Secondary | ICD-10-CM | POA: Diagnosis not present

## 2023-10-15 DIAGNOSIS — B351 Tinea unguium: Secondary | ICD-10-CM | POA: Diagnosis not present

## 2023-10-23 DIAGNOSIS — M25572 Pain in left ankle and joints of left foot: Secondary | ICD-10-CM | POA: Diagnosis not present

## 2023-11-14 ENCOUNTER — Telehealth: Payer: Self-pay

## 2023-11-14 NOTE — Telephone Encounter (Signed)
 Copied from CRM [id number]. Topic: General - Other >> Nov 14, 2023  3:22 PM Montie POUR wrote: Reason for CRM: She has Hulan and has a high copay. She needs a referral to a dermatologist for [facility]. She has a place that came up on her right leg. Please message on MyChart

## 2023-11-14 NOTE — Telephone Encounter (Signed)
Left detailed message for patient to call back and schedule follow up

## 2024-03-25 ENCOUNTER — Ambulatory Visit (HOSPITAL_COMMUNITY)
Admission: EM | Admit: 2024-03-25 | Discharge: 2024-03-25 | Disposition: A | Discharge status: Home / Self Care | Attending: Internal Medicine | Admitting: Internal Medicine

## 2024-03-25 ENCOUNTER — Encounter (HOSPITAL_COMMUNITY): Payer: Self-pay

## 2024-03-25 DIAGNOSIS — R1011 Right upper quadrant pain: Secondary | ICD-10-CM

## 2024-03-25 DIAGNOSIS — R0781 Pleurodynia: Secondary | ICD-10-CM

## 2024-03-25 DIAGNOSIS — R0789 Other chest pain: Secondary | ICD-10-CM

## 2024-03-25 LAB — COMPREHENSIVE METABOLIC PANEL WITH GFR
ALT: 15 U/L (ref 0–44)
AST: 24 U/L (ref 15–41)
Albumin: 3.9 g/dL (ref 3.5–5.0)
Alkaline Phosphatase: 52 U/L (ref 38–126)
Anion gap: 12 (ref 5–15)
BUN: 12 mg/dL (ref 8–23)
CO2: 25 mmol/L (ref 22–32)
Calcium: 9.7 mg/dL (ref 8.9–10.3)
Chloride: 105 mmol/L (ref 98–111)
Creatinine, Ser: 0.67 mg/dL (ref 0.44–1.00)
GFR, Estimated: >60 mL/min (ref >60)
Glucose, Bld: 55 mg/dL — ABNORMAL LOW (ref 70–99)
Potassium: 3.7 mmol/L (ref 3.5–5.1)
Sodium: 142 mmol/L (ref 135–145)
Total Bilirubin: 0.7 mg/dL (ref 0.0–1.2)
Total Protein: 6.1 g/dL — ABNORMAL LOW (ref 6.5–8.1)

## 2024-03-25 LAB — CBC
HCT: 39.6 % (ref 36.0–46.0)
Hemoglobin: 12.7 g/dL (ref 12.0–15.0)
MCH: 31.5 pg (ref 26.0–34.0)
MCHC: 32.1 g/dL (ref 30.0–36.0)
MCV: 98.3 fL (ref 80.0–100.0)
Platelets: 212 10*3/uL (ref 150–400)
RBC: 4.03 MIL/uL (ref 3.87–5.11)
RDW: 12.6 % (ref 11.5–15.5)
WBC: 5.4 10*3/uL (ref 4.0–10.5)
nRBC: 0 % (ref 0.0–0.2)

## 2024-03-25 NOTE — ED Provider Notes (Signed)
 [facility]    CSN: [id number] Arrival date & time: 03/25/24  1049      History   Chief Complaint Chief Complaint  Patient presents with   Rib Injury    HPI Amber Williamson is a 63 y.o. female.   63 year old female who presents urgent care with complaints of right upper quadrant/right flank pain.  She reports this started about 7 days ago.  She has not had any injury to the area.  She reports that it feels like it is right under her ribs.  She reports she gets a little bit sweaty and has a little nausea when it does hurt.  She has tried ibuprofen which did help.  She denies any shortness of breath, chest pain, vomiting, poor appetite, new medications, fevers, chills, diarrhea, abdominal surgeries in the past.  She does relate that her husband just got out of the hospital a week ago due to pneumonia and that she is having to help him a lot including helping him get up and doing more house chores.  She does not express a concern about wanting to have her liver checked.  She also relates that she has had a small lump that is near her xiphoid process that has been present her entire life but this is not causing her any problems.  She is eating and drinking normally.  She denies any dysuria, hematuria, vaginal discharge, vaginal pain, concerns for STI.      Past Medical History:  Diagnosis Date   Cancer of skin of right leg 2018   RIGHT lower leg, squamous cell carcinoma   Skin cancer of face 2014   squamous cell carcinoma   Thyroid  disease    hyperthyroidism, took a PTU blocker, resolved    Patient Active Problem List   Diagnosis Date Noted   Positive colorectal cancer screening using Cologuard test 03/25/2018   History of SCC (squamous cell carcinoma) of skin 04/11/2017    Past Surgical History:  Procedure Laterality Date   BREAST SURGERY     augmentation   SKIN CANCER EXCISION     Forehead 2014, RIGHT lower leg 2018; [facility]    OB History   No obstetric  history on file.      Home Medications    Prior to Admission medications   Medication Sig Start Date End Date Taking? Authorizing Provider  EYSUVIS 0.25 % SUSP Apply 1 drop to eye 3 (three) times daily. 06/01/23   [provider]    Family History Family History  Problem Relation Age of Onset   Heart disease Mother    Atrial fibrillation Mother    Thyroid  disease Mother        hypothyroidism   Heart disease Father    COPD Father    Healthy Daughter    Healthy Son     Social History Social History   Tobacco Use   Smoking status: Former    Current packs/day: 0.00    Average packs/day: 1 pack/day for 28.0 years (28.0 ttl pk-yrs)    Types: Cigarettes    Start date: 11    Quit date: 2009    Years since quitting: 16.3   Smokeless tobacco: Never   Tobacco comments:    Nicorette gum since 2004  Vaping Use   Vaping status: Never Used  Substance Use Topics   Alcohol use: No    Alcohol/week: 0.0 - 1.0 standard drinks of alcohol    Comment: occasional glass of wine  Drug use: No     Allergies   Other   Review of Systems Review of Systems  Constitutional:  Negative for chills and fever.  HENT:  Negative for ear pain and sore throat.   Eyes:  Negative for pain and visual disturbance.  Respiratory:  Negative for cough and shortness of breath.   Cardiovascular:  Negative for chest pain and palpitations.  Gastrointestinal:  Positive for abdominal pain (RUQ vs ribs) and nausea. Negative for constipation, diarrhea and vomiting.  Genitourinary:  Negative for difficulty urinating, dysuria, flank pain, frequency, hematuria, pelvic pain, urgency and vaginal discharge.  Musculoskeletal:  Negative for arthralgias and back pain.  Skin:  Negative for color change and rash.  Neurological:  Negative for seizures and syncope.  All other systems reviewed and are negative.    Physical Exam Triage Vital Signs ED Triage Vitals  Encounter Vitals Group     BP 03/25/24  1218 (!) 127/91     Systolic BP Percentile --      Diastolic BP Percentile --      Pulse Rate 03/25/24 1218 66     Resp 03/25/24 1218 16     Temp 03/25/24 1218 97.9 F (36.6 C)     Temp Source 03/25/24 1218 Oral     SpO2 03/25/24 1218 97 %     Weight --      Height --      Head Circumference --      Peak Flow --      Pain Score 03/25/24 1220 0     Pain Loc --      Pain Education --      Exclude from Growth Chart --    No data found.  Updated Vital Signs BP (!) 127/91 (BP Location: Right Arm)   Pulse 66   Temp 97.9 F (36.6 C) (Oral)   Resp 16   SpO2 97%   Visual Acuity Right Eye Distance:   Left Eye Distance:   Bilateral Distance:    Right Eye Near:   Left Eye Near:    Bilateral Near:     Physical Exam Vitals and nursing note reviewed.  Constitutional:      General: She is not in acute distress.    Appearance: She is well-developed.  HENT:     Head: Normocephalic and atraumatic.     Right Ear: Tympanic membrane normal.     Left Ear: Tympanic membrane normal.     Nose: Nose normal.     Mouth/Throat:     Mouth: Mucous membranes are moist.  Eyes:     Conjunctiva/sclera: Conjunctivae normal.  Cardiovascular:     Rate and Rhythm: Normal rate and regular rhythm.     Heart sounds: No murmur heard. Pulmonary:     Effort: Pulmonary effort is normal. No respiratory distress.     Breath sounds: Normal breath sounds.  Chest:     Chest wall: No mass or swelling. There is no dullness to percussion.    Abdominal:     General: Abdomen is flat. Bowel sounds are normal. There is no distension.     Palpations: Abdomen is rigid. There is no hepatomegaly, mass or pulsatile mass.     Tenderness: There is no abdominal tenderness. There is no guarding or rebound.     Hernia: There is no hernia in the umbilical area or ventral area.  Musculoskeletal:        General: No swelling.     Cervical back: Neck supple.  Skin:    General: Skin is warm and dry.     Capillary  Refill: Capillary refill takes less than 2 seconds.  Neurological:     General: No focal deficit present.     Mental Status: She is alert.  Psychiatric:        Mood and Affect: Mood normal.      UC Treatments / Results  Labs (all labs ordered are listed, but only abnormal results are displayed) Labs Reviewed  CBC  COMPREHENSIVE METABOLIC PANEL WITH GFR    EKG   Radiology No results found.  Procedures Procedures (including critical care time)  Medications Ordered in UC Medications - No data to display  Initial Impression / Assessment and Plan / UC Course  I have reviewed the triage vital signs and the nursing notes.  Pertinent labs & imaging results that were available during my care of the patient were reviewed by me and considered in my medical decision making (see chart for details).     Rib pain on right side  Abdominal pain, right upper quadrant   Right flank pain/right rib pain.  Imaging is deferred as there was no specific injury to the area and likely low yield and there are no respiratory symptoms (lung exam was clear).  Feel that an intra-abdominal process is less likely but will check CBC and CMP today to evaluate liver/kidneys/blood count.  Reassuringly, there is no vomiting, diarrhea, poor appetite, fevers.  Likely symptoms are secondary to costochondritis as this did respond to ibuprofen.  Also there has been recent precipitating event with helping her husband after his illness.  Recommend continuing ibuprofen every 6-8 hours for 2 to 3 days and use either ice or heat on the area.  If symptoms do not continue to improve then recommend following up with your primary care physician.  If symptoms worsen then can return to urgent care, PCP or emergency department for further evaluation.  Final Clinical Impressions(s) / UC Diagnoses   Final diagnoses:  Rib pain on right side  Abdominal pain, right upper quadrant     Discharge Instructions      Right flank  pain/right rib pain.  Imaging is deferred as there was no specific injury to the area and likely low yield and there are no respiratory symptoms (lung exam was clear).  Feel that an intra-abdominal process is less likely but will check CBC and CMP today to evaluate liver/kidneys/blood count.  Reassuringly, there is no vomiting, diarrhea, poor appetite, fevers.  Likely symptoms are secondary to costochondritis as this did respond to ibuprofen.  Also there has been recent precipitating event with helping your husband after his illness.  Recommend continuing ibuprofen every 6-8 hours for 2 to 3 days and use either ice or heat on the area.  If symptoms do not continue to improve then recommend following up with your primary care physician.  If symptoms worsen then can return to urgent care, PCP or emergency department for further evaluation.   ED Prescriptions   None    PDMP not reviewed this encounter.   Kreg Pesa, [place] 03/25/24 1356

## 2024-03-25 NOTE — ED Triage Notes (Signed)
 Pt states pain under her right ribs for the past 2 weeks. States she takes ibuprofen which helps the pain.  States the pain is worse with movement.

## 2024-03-25 NOTE — Discharge Instructions (Addendum)
 Right flank pain/right rib pain.  Imaging is deferred as there was no specific injury to the area and likely low yield and there are no respiratory symptoms (lung exam was clear).  Feel that an intra-abdominal process is less likely but will check CBC and CMP today to evaluate liver/kidneys/blood count.  Reassuringly, there is no vomiting, diarrhea, poor appetite, fevers.  Likely symptoms are secondary to costochondritis as this did respond to ibuprofen.  Also there has been recent precipitating event with helping your husband after his illness.  Recommend continuing ibuprofen every 6-8 hours for 2 to 3 days and use either ice or heat on the area.  If symptoms do not continue to improve then recommend following up with your primary care physician.  If symptoms worsen then can return to urgent care, PCP or emergency department for further evaluation.

## 2024-03-26 ENCOUNTER — Ambulatory Visit (HOSPITAL_COMMUNITY): Payer: Self-pay

## 2024-03-27 ENCOUNTER — Encounter: Payer: Self-pay | Admitting: Physician Assistant

## 2024-03-27 ENCOUNTER — Ambulatory Visit: Admitting: Physician Assistant

## 2024-03-27 VITALS — BP 107/63 | HR 81 | Ht 63.0 in | Wt 112.8 lb

## 2024-03-27 DIAGNOSIS — R5383 Other fatigue: Secondary | ICD-10-CM

## 2024-03-27 DIAGNOSIS — R1011 Right upper quadrant pain: Secondary | ICD-10-CM

## 2024-03-27 DIAGNOSIS — E162 Hypoglycemia, unspecified: Secondary | ICD-10-CM

## 2024-03-27 MED ORDER — CYCLOBENZAPRINE HCL 5 MG PO TABS: 5.0000 mg | ORAL_TABLET | Freq: Every day | ORAL | 0 refills | Status: AC

## 2024-03-27 NOTE — Progress Notes (Signed)
 Established patient visit   Patient: Amber Williamson   DOB: [birth year]   63 y.o. Female  MRN: [id number] Visit Date: 03/27/2024  Today's healthcare provider: Trenton Frock, PA-C   Cc. Ruq pain  Subjective     Discussed the use of AI scribe software for clinical note transcription with the patient, who gave verbal consent to proceed.  History of Present Illness   Amber Williamson is a 63 year old female who presents with rib pain and overall discomfort.  She has experienced right sided rib pain for two weeks, The pain began when her husband was hospitalized for pneumonia, during which she frequently lifted him. Ibuprofen provides slightly better relief than Aleve. There is no association with eating, and no changes in bowel movements or acid reflux.  She feels generally 'off', with increased hunger and trouble sleeping. There have been no recent dietary changes, but she has an increased craving for sweets and a slight increase in wine consumption during her husband's hospital stay.  A recent low blood sugar reading was noted at urgent care, and she does report more frequent dizziness. She is a frequent snacker and has not noticed a significant decrease in food intake.      Medications: Outpatient Medications Prior to Visit  Medication Sig   [DISCONTINUED] EYSUVIS 0.25 % SUSP Apply 1 drop to eye 3 (three) times daily.   No facility-administered medications prior to visit.    Review of Systems  Constitutional:  Negative for fatigue and fever.  Respiratory:  Negative for cough and shortness of breath.   Cardiovascular:  Negative for chest pain and leg swelling.  Gastrointestinal:  Positive for abdominal pain.  Neurological:  Positive for dizziness. Negative for headaches.       Objective    BP 107/63   Pulse 81   Ht 5\' 3"  (1.6 m)   Wt 112 lb 12.8 oz (51.2 kg)   BMI 19.98 kg/m    Physical Exam Vitals reviewed.  Constitutional:      Appearance: She is not  ill-appearing.  HENT:     Head: Normocephalic.  Eyes:     Conjunctiva/sclera: Conjunctivae normal.  Cardiovascular:     Rate and Rhythm: Normal rate.  Pulmonary:     Effort: Pulmonary effort is normal. No respiratory distress.  Abdominal:     General: Abdomen is flat.     Palpations: Abdomen is soft.     Tenderness: There is abdominal tenderness in the right upper quadrant.  Neurological:     Mental Status: She is alert and oriented to person, place, and time.  Psychiatric:        Mood and Affect: Mood normal.        Behavior: Behavior normal.      No results found for any visits on 03/27/24.  Assessment & Plan    RUQ abdominal pain -     US  ABDOMEN LIMITED RUQ (LIVER/GB) -     Cyclobenzaprine HCl; Take 1 tablet (5 mg total) by mouth at bedtime.  Dispense: 15 tablet; Refill: 0  Other fatigue -     Comprehensive metabolic panel with GFR -     Lipase -     TSH -     T4, free  Hypoglycemia -     Hemoglobin A1c   Repeat labs, ruq us  ordered . In case of musculoskeletal etiology, recommending flexeril 5 mg at bedtime.   Given low blood sugar, will repeat cmp, A1c-- but  advised signs/symptoms of hypoglycemia, and what types of foods to eat to bring up sugar quickly.   Return if symptoms worsen or fail to improve.       Trenton Frock, PA-C  [facility] [phone] (phone) [phone] (fax)  [facility]

## 2024-03-28 LAB — COMPREHENSIVE METABOLIC PANEL WITH GFR
ALT: 12 U/L (ref 0–35)
AST: 16 U/L (ref 0–37)
Albumin: 4.3 g/dL (ref 3.5–5.2)
Alkaline Phosphatase: 59 U/L (ref 39–117)
BUN: 19 mg/dL (ref 6–23)
CO2: 28 meq/L (ref 19–32)
Calcium: 9.1 mg/dL (ref 8.4–10.5)
Chloride: 109 meq/L (ref 96–112)
Creatinine, Ser: 0.62 mg/dL (ref 0.40–1.20)
GFR: 93.94 mL/min (ref >60.00)
Glucose, Bld: 75 mg/dL (ref 70–99)
Potassium: 3.9 meq/L (ref 3.5–5.1)
Sodium: 144 meq/L (ref 135–145)
Total Bilirubin: 0.4 mg/dL (ref 0.2–1.2)
Total Protein: 6.5 g/dL (ref 6.0–8.3)

## 2024-03-28 LAB — HEMOGLOBIN A1C: Hgb A1c MFr Bld: 5.3 % (ref 4.6–6.5)

## 2024-03-28 LAB — TSH: TSH: 1.51 u[IU]/mL (ref 0.35–5.50)

## 2024-03-28 LAB — T4, FREE: Free T4: 0.83 ng/dL (ref 0.60–1.60)

## 2024-03-28 LAB — LIPASE: Lipase: 20 U/L (ref 11.0–59.0)

## 2024-04-01 ENCOUNTER — Ambulatory Visit: Payer: Self-pay | Admitting: Physician Assistant

## 2024-04-01 ENCOUNTER — Other Ambulatory Visit
